# Patient Record
Sex: Female | Born: 1945 | Race: White | Hispanic: No | Marital: Married | Smoking: Never smoker
Health system: Southern US, Community
[De-identification: ages and names within clinical notes are randomized; demographics above are authoritative.]

## PROBLEM LIST (undated history)

## (undated) DIAGNOSIS — K219 Gastro-esophageal reflux disease without esophagitis: Secondary | ICD-10-CM

## (undated) DIAGNOSIS — M1990 Unspecified osteoarthritis, unspecified site: Secondary | ICD-10-CM

## (undated) DIAGNOSIS — I4891 Unspecified atrial fibrillation: Secondary | ICD-10-CM

## (undated) DIAGNOSIS — J45909 Unspecified asthma, uncomplicated: Secondary | ICD-10-CM

## (undated) DIAGNOSIS — K589 Irritable bowel syndrome without diarrhea: Secondary | ICD-10-CM

## (undated) DIAGNOSIS — I10 Essential (primary) hypertension: Secondary | ICD-10-CM

## (undated) HISTORY — PX: KNEE RECONSTRUCTION: SHX5883

## (undated) HISTORY — PX: BREAST BIOPSY: SHX20

## (undated) HISTORY — PX: CHOLECYSTECTOMY: SHX55

---

## 2004-02-23 ENCOUNTER — Ambulatory Visit: Payer: Self-pay | Admitting: Internal Medicine

## 2004-02-26 ENCOUNTER — Ambulatory Visit: Payer: Self-pay | Admitting: Internal Medicine

## 2005-01-10 ENCOUNTER — Ambulatory Visit: Payer: Self-pay | Admitting: Internal Medicine

## 2005-05-05 ENCOUNTER — Ambulatory Visit: Payer: Self-pay | Admitting: Internal Medicine

## 2006-05-02 HISTORY — PX: GASTRIC BYPASS: SHX52

## 2006-05-17 ENCOUNTER — Ambulatory Visit: Payer: Self-pay | Admitting: Internal Medicine

## 2006-05-22 ENCOUNTER — Ambulatory Visit: Payer: Self-pay | Admitting: Internal Medicine

## 2007-03-11 ENCOUNTER — Ambulatory Visit: Payer: Self-pay | Admitting: Internal Medicine

## 2007-07-05 ENCOUNTER — Ambulatory Visit: Payer: Self-pay | Admitting: Family Medicine

## 2009-07-23 ENCOUNTER — Ambulatory Visit: Payer: Self-pay | Admitting: Family Medicine

## 2009-09-28 ENCOUNTER — Ambulatory Visit: Payer: Self-pay | Admitting: Internal Medicine

## 2010-09-09 ENCOUNTER — Ambulatory Visit: Payer: Self-pay | Admitting: Family Medicine

## 2014-11-11 ENCOUNTER — Other Ambulatory Visit: Payer: Self-pay | Admitting: Internal Medicine

## 2014-11-11 DIAGNOSIS — Z1231 Encounter for screening mammogram for malignant neoplasm of breast: Secondary | ICD-10-CM

## 2014-11-20 ENCOUNTER — Ambulatory Visit
Admission: RE | Admit: 2014-11-20 | Discharge: 2014-11-20 | Disposition: A | Discharge status: Home / Self Care | Payer: Medicare PPO | Source: Ambulatory Visit | Attending: Internal Medicine | Admitting: Internal Medicine

## 2014-11-20 DIAGNOSIS — Z1231 Encounter for screening mammogram for malignant neoplasm of breast: Secondary | ICD-10-CM | POA: Insufficient documentation

## 2014-12-01 ENCOUNTER — Other Ambulatory Visit: Payer: Self-pay | Admitting: Gastroenterology

## 2014-12-01 DIAGNOSIS — K589 Irritable bowel syndrome without diarrhea: Secondary | ICD-10-CM

## 2014-12-08 ENCOUNTER — Ambulatory Visit
Admission: RE | Admit: 2014-12-08 | Discharge: 2014-12-08 | Disposition: A | Discharge status: Home / Self Care | Payer: Medicare PPO | Source: Ambulatory Visit | Attending: Gastroenterology | Admitting: Gastroenterology

## 2014-12-08 DIAGNOSIS — K589 Irritable bowel syndrome without diarrhea: Secondary | ICD-10-CM | POA: Insufficient documentation

## 2014-12-09 ENCOUNTER — Ambulatory Visit: Payer: Medicare PPO

## 2015-04-04 ENCOUNTER — Encounter: Payer: Self-pay | Admitting: Emergency Medicine

## 2015-04-04 ENCOUNTER — Emergency Department
Admission: EM | Admit: 2015-04-04 | Discharge: 2015-04-04 | Disposition: A | Discharge status: Home / Self Care | Payer: Medicare PPO | Attending: Emergency Medicine | Admitting: Emergency Medicine

## 2015-04-04 DIAGNOSIS — I480 Paroxysmal atrial fibrillation: Secondary | ICD-10-CM | POA: Insufficient documentation

## 2015-04-04 DIAGNOSIS — R531 Weakness: Secondary | ICD-10-CM | POA: Diagnosis present

## 2015-04-04 DIAGNOSIS — I10 Essential (primary) hypertension: Secondary | ICD-10-CM | POA: Diagnosis not present

## 2015-04-04 DIAGNOSIS — Z880 Allergy status to penicillin: Secondary | ICD-10-CM | POA: Diagnosis not present

## 2015-04-04 HISTORY — DX: Unspecified atrial fibrillation: I48.91

## 2015-04-04 HISTORY — DX: Irritable bowel syndrome without diarrhea: K58.9

## 2015-04-04 HISTORY — DX: Essential (primary) hypertension: I10

## 2015-04-04 HISTORY — DX: Unspecified osteoarthritis, unspecified site: M19.90

## 2015-04-04 HISTORY — DX: Unspecified asthma, uncomplicated: J45.909

## 2015-04-04 LAB — COMPREHENSIVE METABOLIC PANEL
ALBUMIN: 4.3 g/dL (ref 3.5–5.0)
ALT: 41 U/L (ref 14–54)
ANION GAP: 8 (ref 5–15)
AST: 36 U/L (ref 15–41)
Alkaline Phosphatase: 95 U/L (ref 38–126)
BUN: 13 mg/dL (ref 6–20)
CALCIUM: 9.4 mg/dL (ref 8.9–10.3)
CO2: 27 mmol/L (ref 22–32)
Chloride: 106 mmol/L (ref 101–111)
Creatinine, Ser: 0.53 mg/dL (ref 0.44–1.00)
GFR calc non Af Amer: >60 mL/min (ref >60)
GLUCOSE: 126 mg/dL — AB (ref 65–99)
POTASSIUM: 3.7 mmol/L (ref 3.5–5.1)
SODIUM: 141 mmol/L (ref 135–145)
TOTAL PROTEIN: 7.5 g/dL (ref 6.5–8.1)
Total Bilirubin: 0.4 mg/dL (ref 0.3–1.2)

## 2015-04-04 LAB — CBC
HCT: 42.1 % (ref 35.0–47.0)
Hemoglobin: 13.3 g/dL (ref 12.0–16.0)
MCH: 25.2 pg — AB (ref 26.0–34.0)
MCHC: 31.6 g/dL — AB (ref 32.0–36.0)
MCV: 79.6 fL — ABNORMAL LOW (ref 80.0–100.0)
Platelets: 454 10*3/uL — ABNORMAL HIGH (ref 150–440)
RBC: 5.29 MIL/uL — ABNORMAL HIGH (ref 3.80–5.20)
RDW: 16.4 % — AB (ref 11.5–14.5)
WBC: 9.3 10*3/uL (ref 3.6–11.0)

## 2015-04-04 LAB — TROPONIN I: Troponin I: <0.03 ng/mL (ref <0.031)

## 2015-04-04 MED ORDER — SODIUM CHLORIDE 0.9 % IV SOLN
1000.0000 mL | Freq: Once | INTRAVENOUS | Status: AC
  Administered 2015-04-04: 1000 mL via INTRAVENOUS

## 2015-04-04 MED ORDER — METOPROLOL TARTRATE 1 MG/ML IV SOLN
2.5000 mg | Freq: Once | INTRAVENOUS | Status: AC
  Administered 2015-04-04: 2.5 mg via INTRAVENOUS
  Filled 2015-04-04: qty 5

## 2015-04-04 MED ORDER — SODIUM CHLORIDE 0.9 % IV BOLUS (SEPSIS)
1000.0000 mL | Freq: Once | INTRAVENOUS | Status: AC
  Administered 2015-04-04: 1000 mL via INTRAVENOUS

## 2015-04-04 NOTE — Discharge Instructions (Signed)

## 2015-04-04 NOTE — ED Notes (Signed)
NAD noted at time of D/C. Pt denies comments/concerns at this time. Pt ambulatory to the lobby to wait for her husband at this time.

## 2015-04-04 NOTE — ED Provider Notes (Signed)
[facility] Emergency Department Provider Note  ____________________________________________  Time seen: On arrival  I have reviewed the triage vital signs and the nursing notes.   HISTORY  Chief Complaint Weakness    HPI Bethany Maxwell is a 69 y.o. female who presents with complaints of lightheadedness and dizziness and generalized weakness. Patient reports last night she was watching TV and felt her heart go into atrial fibrillation, this is happened before. Typically she is in a normal rhythm. She takes metoprolol 50 mg every morning. Because she felt her heart was racing and her blood pressure was high she took a full second dose of metoprolol at 11 PM last night. After a couple of hours it did not improve so she took another dose of 50 mg of metoprolol. She woke up in the middle the night feeling weak and dizzy but decided to see if he'll get better. This morning she continued to feel very weak and lightheaded so came to the emergency department. She denies chest pain at this time. No recent travel. No calf pain. No shortness of breath. No cough, no dysuria.     Past Medical History  Diagnosis Date  . Hypertension   . Atrial fibrillation (HCC)   . IBS (irritable bowel syndrome)   . Osteoarthritis   . Asthma     There are no active problems to display for this patient.   Past Surgical History  Procedure Laterality Date  . Breast biopsy Right     neg- needle bx-no scar  . Gastric bypass  2008  . Knee reconstruction      bilat  . Cholecystectomy      No current outpatient prescriptions on file.  Allergies Cyclobenzaprine and Penicillins  Family History  Problem Relation Age of Onset  . Breast cancer Maternal Aunt 75  . Breast cancer Cousin     Social History Social History  Substance Use Topics  . Smoking status: Never Smoker   . Smokeless tobacco: None  . Alcohol Use: No    Review of Systems  Constitutional: Negative for  fever. Eyes: Negative for visual changes. ENT: Negative for sore throat Cardiovascular: Positive for chest tightness last night, palpitations Respiratory: Negative for shortness of breath. Gastrointestinal: Negative for abdominal pain, vomiting and diarrhea. Genitourinary: Negative for dysuria. Musculoskeletal: Negative for back pain. Skin: Negative for rash. Neurological: Negative for headaches or focal weakness Psychiatric: No anxiety    ____________________________________________   PHYSICAL EXAM:  VITAL SIGNS: ED Triage Vitals  Enc Vitals Group     BP 04/04/15 0918 111/65 mmHg     Pulse Rate 04/04/15 0918 134     Resp 04/04/15 0918 18     Temp 04/04/15 0918 97.6 F (36.4 C)     Temp Source 04/04/15 0918 Oral     SpO2 04/04/15 0918 99 %     Weight --      Height --      Head Cir --      Peak Flow --      Pain Score 04/04/15 0900 0     Pain Loc --      Pain Edu? --      Excl. in GC? --      Constitutional: Alert and oriented. Well appearing and in no distress. Pleasant and interactive Eyes: Conjunctivae are normal.  ENT   Head: Normocephalic and atraumatic.   Mouth/Throat: Mucous membranes are moist. Cardiovascular: Tachycardia, irregularly irregular rhythm. Normal and symmetric distal pulses are present  in all extremities. No murmurs, rubs, or gallops. Respiratory: Normal respiratory effort without tachypnea nor retractions. Breath sounds are clear and equal bilaterally.  Gastrointestinal: Soft and non-tender in all quadrants. No distention. There is no CVA tenderness. Genitourinary: deferred Musculoskeletal: Nontender with normal range of motion in all extremities. No lower extremity tenderness nor edema. Neurologic:  Normal speech and language. No gross focal neurologic deficits are appreciated. Skin:  Skin is warm, dry and intact. No rash noted. Psychiatric: Mood and affect are normal. Patient exhibits appropriate insight and  judgment.  ____________________________________________    LABS (pertinent positives/negatives)  Labs Reviewed - No data to display  ____________________________________________   EKG  ED ECG REPORT I, Jene EveryKINNER, Kynnedy Carreno, the attending physician, personally viewed and interpreted this ECG.   Date: 04/04/2015  EKG Time: 9:13 AM  Rate: 134  Rhythm: Atrial fibrillation  Axis: Left axis deviation  Intervals:none  ST&T Change: Nonspecific   ____________________________________________    RADIOLOGY I have personally reviewed any xrays that were ordered on this patient: None  ____________________________________________   PROCEDURES  Procedure(s) performed: none  Critical Care performed: none  ____________________________________________   INITIAL IMPRESSION / ASSESSMENT AND PLAN / ED COURSE  Pertinent labs & imaging results that were available during my care of the patient were reviewed by me and considered in my medical decision making (see chart for details).  Patient presents with atrial fibrillation with apparent RVR. This may be the cause of her dizziness although we need to be careful because triage nurse reported a low heart rate on initial triage and given that patient has taken significant extra metoprolol and we will start with IV fluids and hydration and observe her heart rate. If she continues to be tachycardic we will then give Cardizem or metoprolol IV. In the meantime we'll check cardiac enzymes and monitor the patient  ----------------------------------------- 12:49 PM on 04/04/2015 -----------------------------------------  Discussed with cardiology Dr. Lady GaryFath, he agrees with 2.5 mg of IV Lopressor, and fluids and Dr. Darrold JunkerParaschos can see the patient in the office early this week.  Discussed this plan with patient and she agrees. She will take half of her usual metoprolol dose at home. She feels well and has no dizziness. Heart rate is fluctuating from the  low 100s to 110, blood pressure is normal . Labs are unremarkable. Return precautions discussed   ____________________________________________   FINAL CLINICAL IMPRESSION(S) / ED DIAGNOSES  Final diagnoses:  Paroxysmal atrial fibrillation (HCC)     Jene Everyobert Maudine Kluesner, MD 04/04/15 1414

## 2015-04-04 NOTE — ED Notes (Signed)
Reports feeling sob and weak last pm, bp was high so pt took extra metoprolol, now bp is low and pt feels more weak.  Grips equal, face symmetrical.

## 2015-11-11 ENCOUNTER — Other Ambulatory Visit: Payer: Self-pay | Admitting: Internal Medicine

## 2015-11-11 DIAGNOSIS — Z1231 Encounter for screening mammogram for malignant neoplasm of breast: Secondary | ICD-10-CM

## 2015-11-26 ENCOUNTER — Other Ambulatory Visit: Payer: Self-pay | Admitting: Internal Medicine

## 2015-11-26 ENCOUNTER — Ambulatory Visit
Admission: RE | Admit: 2015-11-26 | Discharge: 2015-11-26 | Disposition: A | Discharge status: Home / Self Care | Payer: Medicare Other | Source: Ambulatory Visit | Attending: Internal Medicine | Admitting: Internal Medicine

## 2015-11-26 ENCOUNTER — Ambulatory Visit (HOSPITAL_BASED_OUTPATIENT_CLINIC_OR_DEPARTMENT_OTHER): Payer: Medicare PPO

## 2015-11-26 DIAGNOSIS — Z1231 Encounter for screening mammogram for malignant neoplasm of breast: Secondary | ICD-10-CM

## 2016-05-01 ENCOUNTER — Encounter: Payer: Self-pay | Admitting: Emergency Medicine

## 2016-05-01 ENCOUNTER — Ambulatory Visit
Admission: EM | Admit: 2016-05-01 | Discharge: 2016-05-01 | Disposition: A | Discharge status: Home / Self Care | Payer: Medicare Other | Attending: Family Medicine | Admitting: Family Medicine

## 2016-05-01 DIAGNOSIS — J0100 Acute maxillary sinusitis, unspecified: Secondary | ICD-10-CM

## 2016-05-01 MED ORDER — AZITHROMYCIN 250 MG PO TABS: 250.0000 mg | ORAL_TABLET | Freq: Every day | ORAL | 0 refills | Status: DC

## 2016-05-01 NOTE — ED Provider Notes (Signed)
CSN: [id number]     Arrival date & time 05/01/16  [id number] History   First MD Initiated Contact with Patient 05/01/16 1003     Chief Complaint  Patient presents with  . Facial Pain  . Nasal Congestion   (Consider location/radiation/quality/duration/timing/severity/associated sxs/prior Treatment) Patient is a 70 year old female, presents today for 3 day history of severe nasal congestion, sinus pressure and pain, sneezing and coughing. Patient denies fever or chills, she does endorses extreme fatigue. Denies any alleviating or aggravating factor. Modifying factors: none.   Blood pressure noted to be very elevated today. Patient does have history of hypertension. Patient reports that her blood pressure is normally 110/70. She reports being very stressful recently.      Past Medical History:  Diagnosis Date  . Asthma   . Atrial fibrillation (HCC)   . Hypertension   . IBS (irritable bowel syndrome)   . Osteoarthritis    Past Surgical History:  Procedure Laterality Date  . BREAST BIOPSY Right    neg- needle bx-no scar  . CHOLECYSTECTOMY    . GASTRIC BYPASS  2008  . KNEE RECONSTRUCTION     bilat   Family History  Problem Relation Age of Onset  . Breast cancer Maternal Aunt 75  . Breast cancer Cousin     3 cousins both sides  . Breast cancer Paternal Aunt    Social History  Substance Use Topics  . Smoking status: Never Smoker  . Smokeless tobacco: Never Used  . Alcohol use No   OB History    No data available     Review of Systems  Constitutional: Positive for fatigue. Negative for chills and fever.  HENT: Positive for congestion, sinus pain, sinus pressure and sneezing. Negative for rhinorrhea and sore throat.   Respiratory: Positive for cough. Negative for shortness of breath and wheezing.   Cardiovascular: Negative for chest pain and palpitations.  Gastrointestinal: Negative for abdominal pain, nausea and vomiting.  Musculoskeletal: Negative for myalgias.   Neurological: Negative for dizziness and headaches.    Allergies  Cyclobenzaprine and Penicillins  Home Medications   Prior to Admission medications   Medication Sig Start Date End Date Taking? Authorizing Provider  fluticasone (FLONASE) 50 MCG/ACT nasal spray Place 1 spray into both nostrils daily.   Yes Historical Provider, MD  magnesium oxide (MAG-OX) 400 MG tablet Take 400 mg by mouth daily.   Yes Historical Provider, MD  metoprolol (LOPRESSOR) 50 MG tablet Take 50 mg by mouth 2 (two) times daily.   Yes Historical Provider, MD  Multiple Vitamin (MULTIVITAMIN) tablet Take 1 tablet by mouth daily.   Yes Historical Provider, MD  omeprazole (PRILOSEC) 20 MG capsule Take 20 mg by mouth daily.   Yes Historical Provider, MD  warfarin (COUMADIN) 3 MG tablet Take 3 mg by mouth daily.   Yes Historical Provider, MD  azithromycin (ZITHROMAX) 250 MG tablet Take 1 tablet (250 mg total) by mouth daily. Take first 2 tablets together, then 1 every day until finished. 05/01/16   Lucia EstelleFeng Markcus Lazenby, NP   Meds Ordered and Administered this Visit  Medications - No data to display  BP (!) 166/86   Pulse 60   Temp 97.8 F (36.6 C) (Oral)   Resp 16   Ht 5\' 3"  (1.6 m)   Wt 185 lb (83.9 kg)   SpO2 100%   BMI 32.77 kg/m  No data found.   Physical Exam  Constitutional: She is oriented to person, place, and time. She appears well-developed and  well-nourished.  HENT:  Head: Normocephalic and atraumatic.  Right Ear: External ear normal.  Left Ear: External ear normal.  Nose: Nose normal.  Mouth/Throat: Oropharynx is clear and moist. No oropharyngeal exudate.  TMs pearly gray bilaterally. Maxillary sinuses are very tender to palpate and percuss.   Eyes: Conjunctivae are normal. Pupils are equal, round, and reactive to light.  Neck: Normal range of motion.  Cardiovascular: Normal rate, regular rhythm and normal heart sounds.   No murmur heard. Heart rhythm is regular rate now. Does have a history of  paroxysmal A. fib  Pulmonary/Chest: Effort normal and breath sounds normal.  Abdominal: Soft. Bowel sounds are normal.  Lymphadenopathy:    She has no cervical adenopathy.  Neurological: She is alert and oriented to person, place, and time.  Skin: Skin is warm and dry.  Nursing note and vitals reviewed.   Urgent Care Course   Clinical Course     Procedures (including critical care time)  Labs Review Labs Reviewed - No data to display  Imaging Review No results found.  MDM   1. Acute maxillary sinusitis, recurrence not specified    Will treat empirically for sinusitis. Patient prefers z-pack. Prescription given.  Reviewed directions for usage and side effects. Patient states understanding and will call with questions or problems.   Continue to monitor blood pressure at home. F/u with  PCP as scheduled or sooner if BP remains elevated at home.    Lucia EstelleFeng Cailah Reach, NP 05/01/16 1016

## 2016-05-01 NOTE — ED Triage Notes (Signed)
Patient c/o sinus congestion and pressure that started yesterday. Patient reports cough that started this morning. Patient denies fevers.

## 2016-10-13 ENCOUNTER — Other Ambulatory Visit: Payer: Self-pay | Admitting: Internal Medicine

## 2016-10-13 DIAGNOSIS — Z1231 Encounter for screening mammogram for malignant neoplasm of breast: Secondary | ICD-10-CM

## 2016-10-28 IMAGING — MG MM DIGITAL SCREENING BILAT W/ CAD
1 series · 4 of 4 positions shown · non-contrast
Comparison: Previous exam(s).

CLINICAL DATA: Screening.

EXAM:
DIGITAL SCREENING BILATERAL MAMMOGRAM WITH CAD

[R CC · right · 4 of 4 slices shown]
[im 1/4]
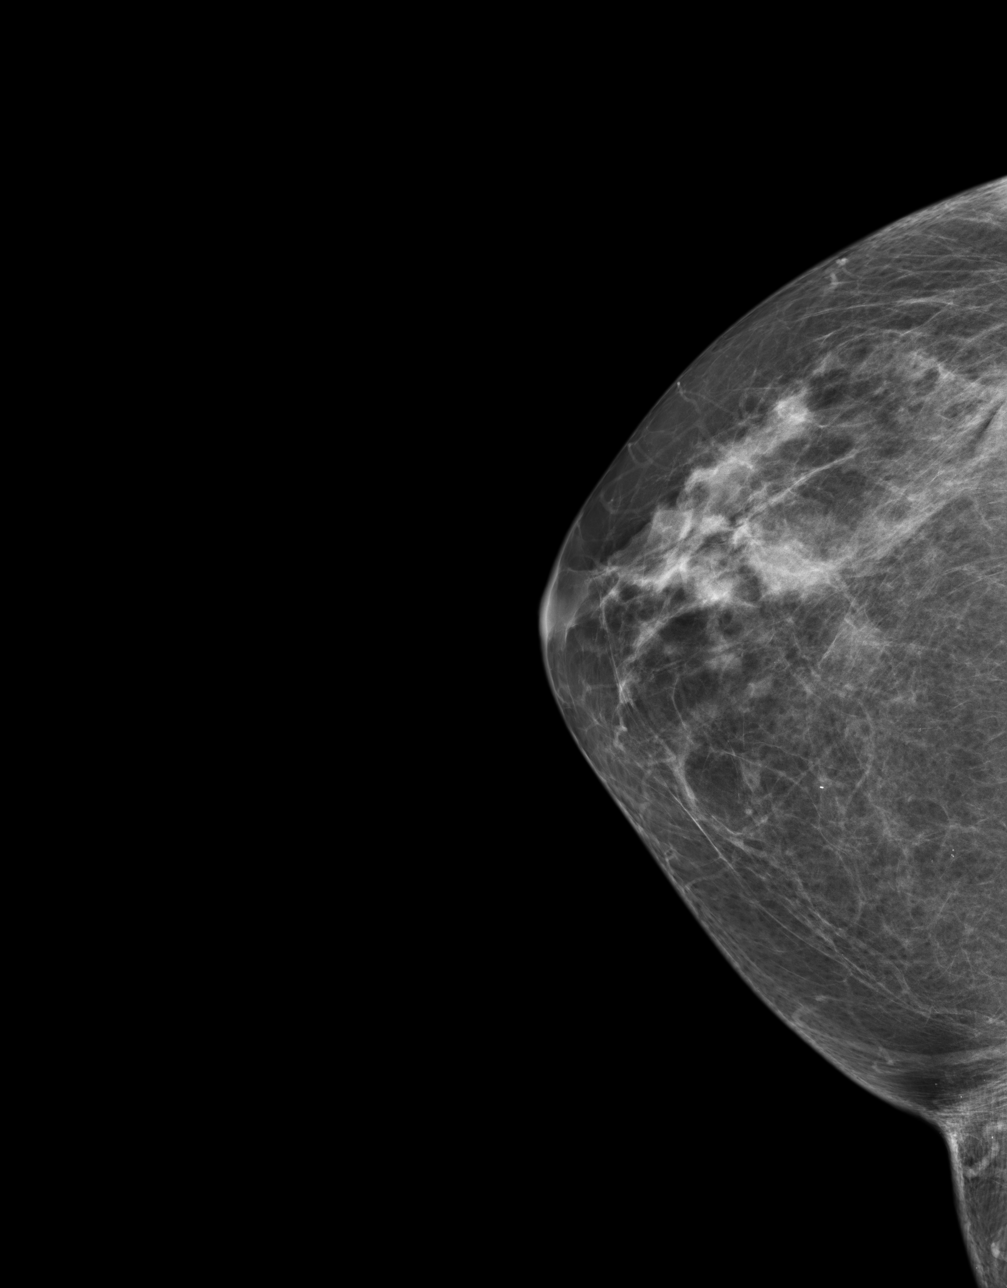
[im 2/4]
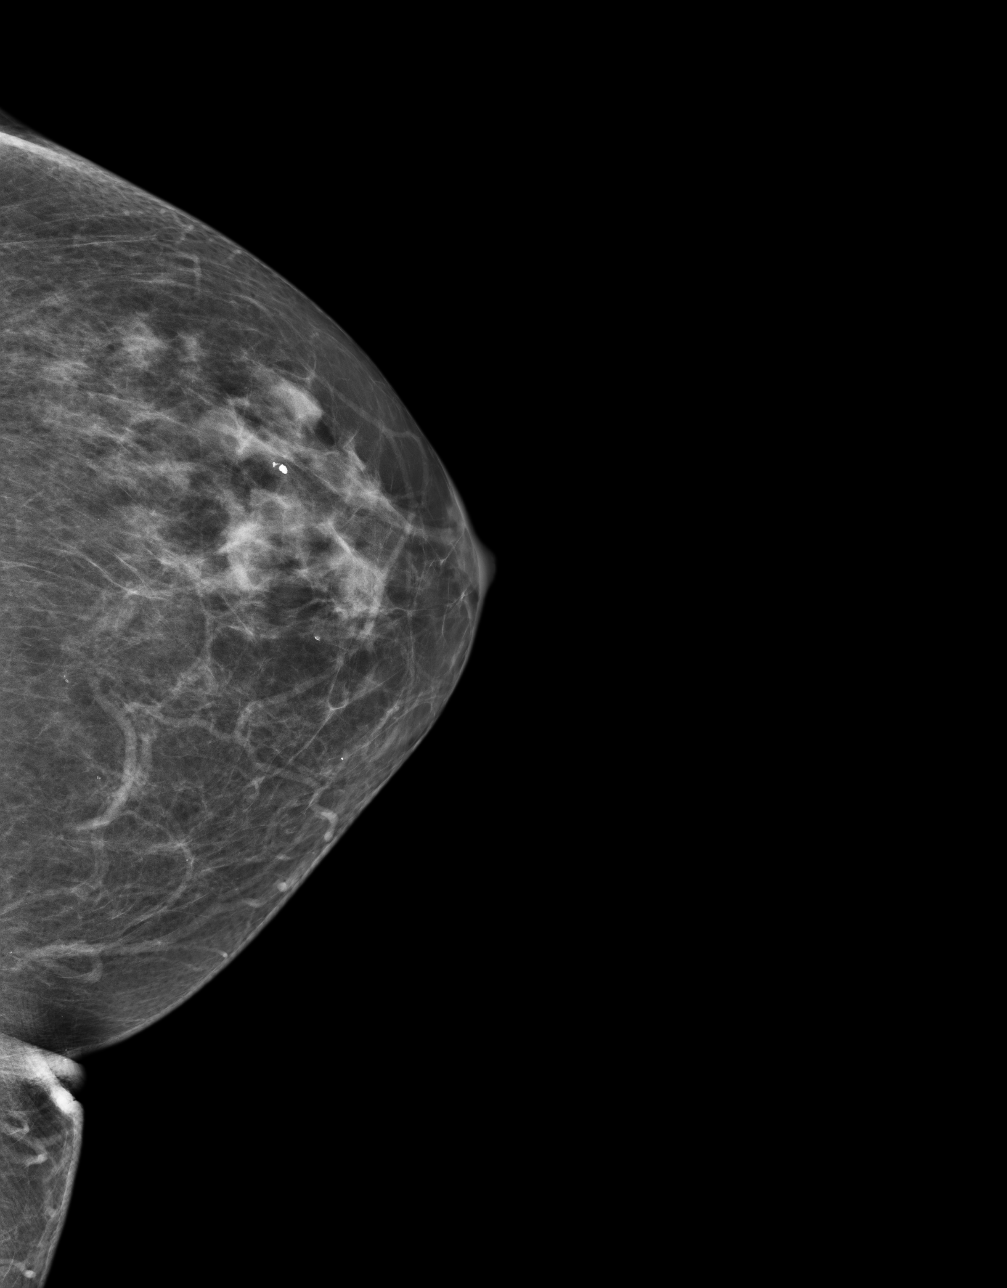
[im 3/4]
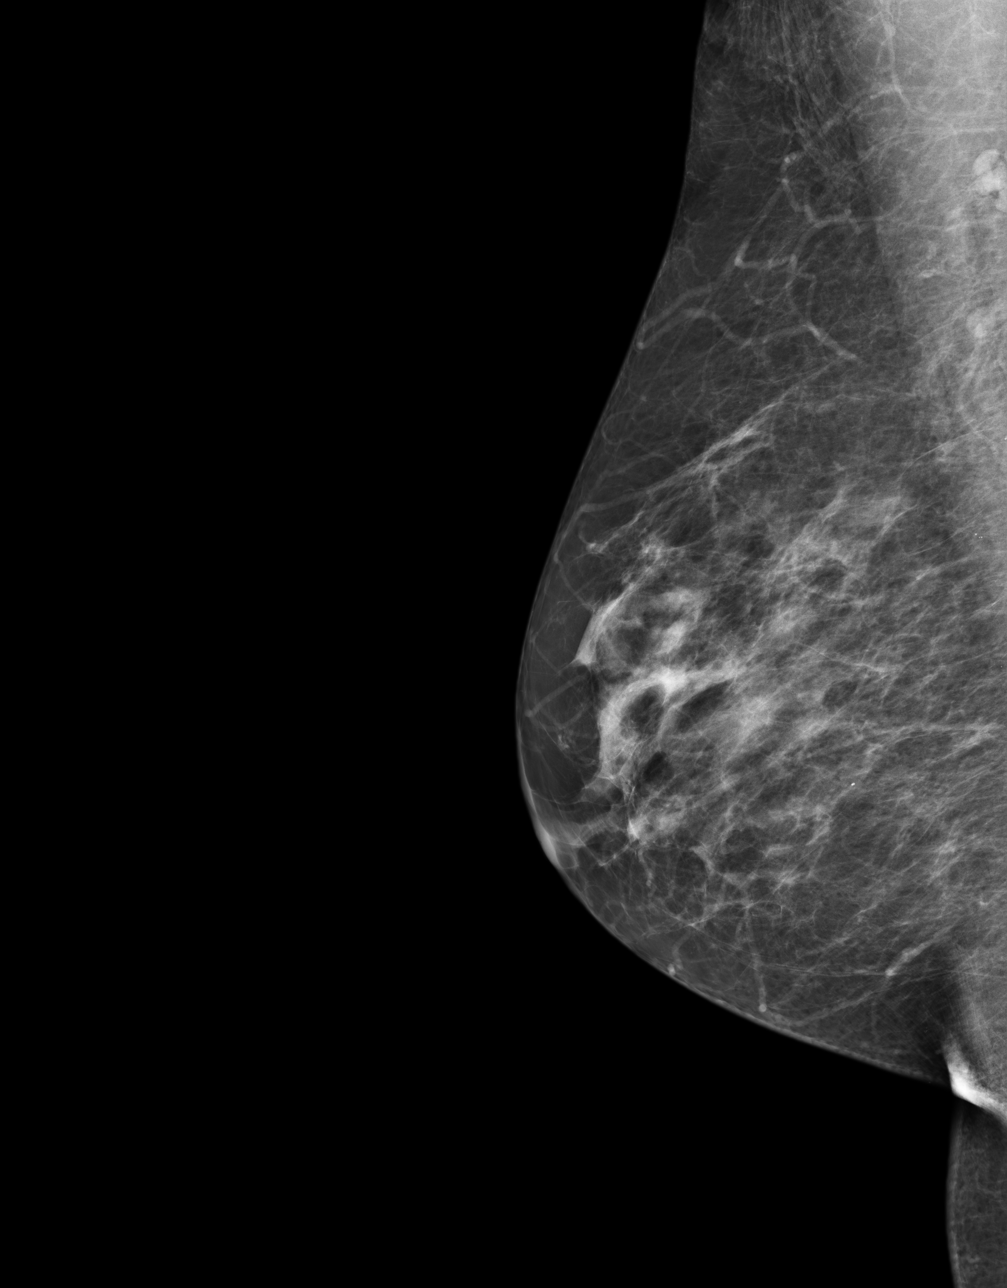
[im 4/4]
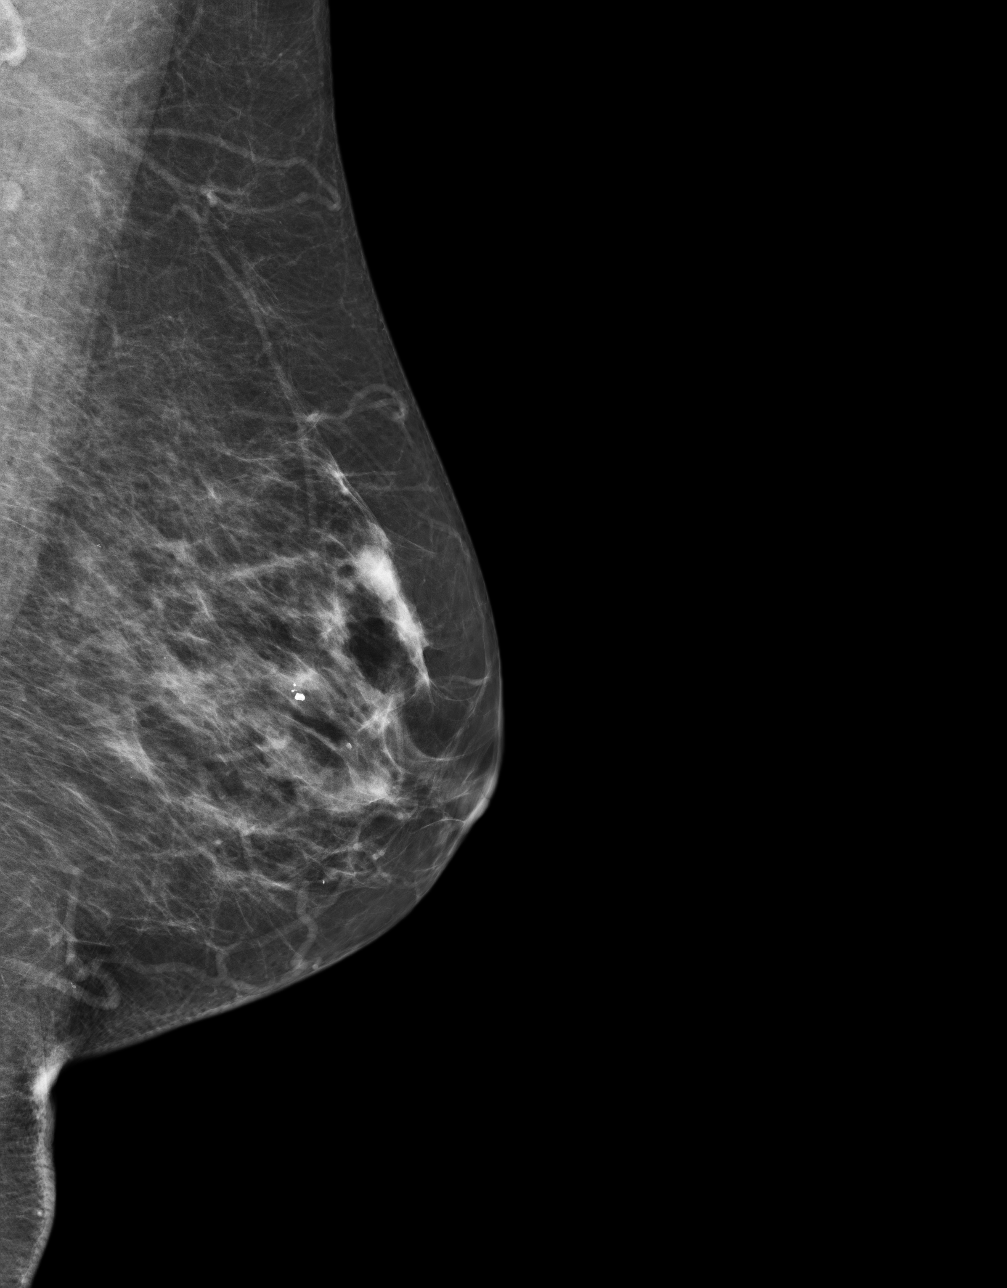

[4 of 4 positions shown; findings below may reference images not displayed]

ACR Breast Density Category b: There are scattered areas of
fibroglandular density.
FINDINGS: There are no findings suspicious for malignancy. Images were
processed with CAD.
IMPRESSION: No mammographic evidence of malignancy. A result letter of this
screening mammogram will be mailed directly to the patient.

RECOMMENDATION:
Screening mammogram in one year. (Code:AS-G-LCT)

BI-RADS CATEGORY  1: Negative.

## 2016-11-28 ENCOUNTER — Ambulatory Visit
Admission: RE | Admit: 2016-11-28 | Discharge: 2016-11-28 | Disposition: A | Discharge status: Home / Self Care | Payer: Medicare Other | Source: Ambulatory Visit | Attending: Internal Medicine | Admitting: Internal Medicine

## 2016-11-28 DIAGNOSIS — Z1231 Encounter for screening mammogram for malignant neoplasm of breast: Secondary | ICD-10-CM | POA: Insufficient documentation

## 2016-12-11 DIAGNOSIS — R0789 Other chest pain: Principal | ICD-10-CM | POA: Insufficient documentation

## 2016-12-11 DIAGNOSIS — Z803 Family history of malignant neoplasm of breast: Secondary | ICD-10-CM | POA: Diagnosis not present

## 2016-12-11 DIAGNOSIS — R079 Chest pain, unspecified: Secondary | ICD-10-CM | POA: Diagnosis present

## 2016-12-11 DIAGNOSIS — Z888 Allergy status to other drugs, medicaments and biological substances status: Secondary | ICD-10-CM | POA: Diagnosis not present

## 2016-12-11 DIAGNOSIS — K219 Gastro-esophageal reflux disease without esophagitis: Secondary | ICD-10-CM | POA: Insufficient documentation

## 2016-12-11 DIAGNOSIS — J45909 Unspecified asthma, uncomplicated: Secondary | ICD-10-CM | POA: Diagnosis not present

## 2016-12-11 DIAGNOSIS — I200 Unstable angina: Secondary | ICD-10-CM | POA: Diagnosis not present

## 2016-12-11 DIAGNOSIS — Z9884 Bariatric surgery status: Secondary | ICD-10-CM | POA: Insufficient documentation

## 2016-12-11 DIAGNOSIS — Z7951 Long term (current) use of inhaled steroids: Secondary | ICD-10-CM | POA: Diagnosis not present

## 2016-12-11 DIAGNOSIS — I10 Essential (primary) hypertension: Secondary | ICD-10-CM | POA: Diagnosis not present

## 2016-12-11 DIAGNOSIS — Z881 Allergy status to other antibiotic agents status: Secondary | ICD-10-CM | POA: Diagnosis not present

## 2016-12-11 DIAGNOSIS — I480 Paroxysmal atrial fibrillation: Secondary | ICD-10-CM | POA: Insufficient documentation

## 2016-12-11 DIAGNOSIS — Z7901 Long term (current) use of anticoagulants: Secondary | ICD-10-CM | POA: Insufficient documentation

## 2016-12-11 DIAGNOSIS — Z9049 Acquired absence of other specified parts of digestive tract: Secondary | ICD-10-CM | POA: Insufficient documentation

## 2016-12-11 DIAGNOSIS — Z880 Allergy status to penicillin: Secondary | ICD-10-CM | POA: Insufficient documentation

## 2016-12-11 DIAGNOSIS — M1990 Unspecified osteoarthritis, unspecified site: Secondary | ICD-10-CM | POA: Insufficient documentation

## 2016-12-11 NOTE — ED Triage Notes (Signed)
Patient to ED for chest pressure. States she had an abnormal EKG at Dr. Cassie FreerParachos' office. Called her the next day and scheduled a stress test but has not had that appt yet.  Patient states she a history of Afib and wore a holter monitor in July but it only captured Afib. Patient currently able to speak in complete sentences without noted distress.

## 2016-12-12 ENCOUNTER — Encounter: Payer: Self-pay | Admitting: Internal Medicine

## 2016-12-12 ENCOUNTER — Emergency Department: Payer: Medicare Other

## 2016-12-12 ENCOUNTER — Observation Stay: Payer: Medicare Other

## 2016-12-12 ENCOUNTER — Observation Stay
Admission: EM | Admit: 2016-12-12 | Discharge: 2016-12-12 | Disposition: A | Discharge status: Home / Self Care | Payer: Medicare Other | Attending: Internal Medicine | Admitting: Internal Medicine

## 2016-12-12 ENCOUNTER — Observation Stay: Admit: 2016-12-12 | Payer: Medicare Other

## 2016-12-12 DIAGNOSIS — R079 Chest pain, unspecified: Secondary | ICD-10-CM | POA: Diagnosis present

## 2016-12-12 DIAGNOSIS — I200 Unstable angina: Secondary | ICD-10-CM

## 2016-12-12 DIAGNOSIS — I10 Essential (primary) hypertension: Secondary | ICD-10-CM | POA: Diagnosis present

## 2016-12-12 DIAGNOSIS — K219 Gastro-esophageal reflux disease without esophagitis: Secondary | ICD-10-CM | POA: Diagnosis present

## 2016-12-12 DIAGNOSIS — I480 Paroxysmal atrial fibrillation: Secondary | ICD-10-CM | POA: Diagnosis present

## 2016-12-12 HISTORY — DX: Gastro-esophageal reflux disease without esophagitis: K21.9

## 2016-12-12 LAB — BASIC METABOLIC PANEL
ANION GAP: 5 (ref 5–15)
Anion gap: 7 (ref 5–15)
BUN: 9 mg/dL (ref 6–20)
BUN: 9 mg/dL (ref 6–20)
CHLORIDE: 108 mmol/L (ref 101–111)
CO2: 27 mmol/L (ref 22–32)
CO2: 27 mmol/L (ref 22–32)
Calcium: 8.7 mg/dL — ABNORMAL LOW (ref 8.9–10.3)
Calcium: 9.2 mg/dL (ref 8.9–10.3)
Chloride: 105 mmol/L (ref 101–111)
Creatinine, Ser: 0.57 mg/dL (ref 0.44–1.00)
Creatinine, Ser: 0.63 mg/dL (ref 0.44–1.00)
GFR calc Af Amer: >60 mL/min (ref >60)
GLUCOSE: 116 mg/dL — AB (ref 65–99)
Glucose, Bld: 97 mg/dL (ref 65–99)
POTASSIUM: 4.2 mmol/L (ref 3.5–5.1)
POTASSIUM: 4.3 mmol/L (ref 3.5–5.1)
SODIUM: 140 mmol/L (ref 135–145)
Sodium: 139 mmol/L (ref 135–145)

## 2016-12-12 LAB — CBC
HEMATOCRIT: 34.7 % — AB (ref 35.0–47.0)
HEMATOCRIT: 37.9 % (ref 35.0–47.0)
HEMOGLOBIN: 11.4 g/dL — AB (ref 12.0–16.0)
HEMOGLOBIN: 12.7 g/dL (ref 12.0–16.0)
MCH: 27.2 pg (ref 26.0–34.0)
MCH: 27.7 pg (ref 26.0–34.0)
MCHC: 32.8 g/dL (ref 32.0–36.0)
MCHC: 33.4 g/dL (ref 32.0–36.0)
MCV: 83 fL (ref 80.0–100.0)
MCV: 83 fL (ref 80.0–100.0)
Platelets: 321 10*3/uL (ref 150–440)
Platelets: 359 10*3/uL (ref 150–440)
RBC: 4.18 MIL/uL (ref 3.80–5.20)
RBC: 4.56 MIL/uL (ref 3.80–5.20)
RDW: 15.9 % — ABNORMAL HIGH (ref 11.5–14.5)
RDW: 16.2 % — ABNORMAL HIGH (ref 11.5–14.5)
WBC: 6.6 10*3/uL (ref 3.6–11.0)
WBC: 7.4 10*3/uL (ref 3.6–11.0)

## 2016-12-12 LAB — PROTIME-INR
INR: 3.17
Prothrombin Time: 33.2 seconds — ABNORMAL HIGH (ref 11.4–15.2)

## 2016-12-12 LAB — TROPONIN I
Troponin I: <0.03 ng/mL (ref <0.03)
Troponin I: <0.03 ng/mL (ref <0.03)

## 2016-12-12 MED ORDER — ACETAMINOPHEN 325 MG PO TABS
650.0000 mg | ORAL_TABLET | Freq: Four times a day (QID) | ORAL | Status: DC | PRN
  Administered 2016-12-12 (×2): 650 mg via ORAL
  Filled 2016-12-12 (×2): qty 2

## 2016-12-12 MED ORDER — HYDRALAZINE HCL 20 MG/ML IJ SOLN: 10.0000 mg | INTRAMUSCULAR | Status: DC | PRN

## 2016-12-12 MED ORDER — LISINOPRIL 5 MG PO TABS: 5.0000 mg | ORAL_TABLET | Freq: Every day | ORAL | 11 refills | Status: AC

## 2016-12-12 MED ORDER — ASPIRIN 81 MG PO CHEW
324.0000 mg | CHEWABLE_TABLET | Freq: Once | ORAL | Status: AC
  Administered 2016-12-12: 324 mg via ORAL
  Filled 2016-12-12: qty 4

## 2016-12-12 MED ORDER — OXYCODONE HCL 5 MG PO TABS: 5.0000 mg | ORAL_TABLET | ORAL | Status: DC | PRN

## 2016-12-12 MED ORDER — NITROGLYCERIN 2 % TD OINT
1.0000 [in_us] | TOPICAL_OINTMENT | Freq: Once | TRANSDERMAL | Status: AC
  Administered 2016-12-12: 1 [in_us] via TOPICAL
  Filled 2016-12-12: qty 1

## 2016-12-12 MED ORDER — ONDANSETRON HCL 4 MG PO TABS: 4.0000 mg | ORAL_TABLET | Freq: Four times a day (QID) | ORAL | Status: DC | PRN

## 2016-12-12 MED ORDER — REGADENOSON 0.4 MG/5ML IV SOLN
0.4000 mg | Freq: Once | INTRAVENOUS | Status: AC
  Administered 2016-12-12: 0.4 mg via INTRAVENOUS

## 2016-12-12 MED ORDER — METOPROLOL SUCCINATE ER 50 MG PO TB24: 50.0000 mg | ORAL_TABLET | Freq: Two times a day (BID) | ORAL | Status: DC

## 2016-12-12 MED ORDER — ACETAMINOPHEN 650 MG RE SUPP: 650.0000 mg | Freq: Four times a day (QID) | RECTAL | Status: DC | PRN

## 2016-12-12 MED ORDER — TECHNETIUM TC 99M TETROFOSMIN IV KIT
13.3400 | PACK | Freq: Once | INTRAVENOUS | Status: AC | PRN
  Administered 2016-12-12: 13.34 via INTRAVENOUS

## 2016-12-12 MED ORDER — PANTOPRAZOLE SODIUM 40 MG PO TBEC: 40.0000 mg | DELAYED_RELEASE_TABLET | Freq: Every day | ORAL | Status: DC

## 2016-12-12 MED ORDER — TECHNETIUM TC 99M TETROFOSMIN IV KIT
30.8500 | PACK | Freq: Once | INTRAVENOUS | Status: AC | PRN
  Administered 2016-12-12: 30.85 via INTRAVENOUS

## 2016-12-12 MED ORDER — ONDANSETRON HCL 4 MG/2ML IJ SOLN: 4.0000 mg | Freq: Four times a day (QID) | INTRAMUSCULAR | Status: DC | PRN

## 2016-12-12 NOTE — Progress Notes (Signed)
Patient admitted without any complications. Blood pressure is elevated but has improved. No complaints of pain. Home medication is being stored in pharmacy.

## 2016-12-12 NOTE — Progress Notes (Signed)
Patient alert and oriented, vss, no complaints of pain.  D/c telemetry and d/c piv.  No questions.  Escorted out of hospital via wheelchair by volunteers.    Medications returned to patient.

## 2016-12-12 NOTE — Progress Notes (Signed)
Chaplain responded to OR requisition for Advanced Directives. CH met with pt. Pt was delightful and talked about her children and grandchildren, and she also mentioned about where she worked before she retirement. Trosky educated pt on how to complete AD. Pt requested for time to review material and asked chaplain to come back later this afternoon to assist Pt complete AD.   12/12/16 1000  Clinical Encounter Type  Visited With Patient  Visit Type Initial  Referral From Nurse  Consult/Referral To Chaplain;Other (Comment)  Spiritual Encounters  Spiritual Needs Other (Comment)

## 2016-12-12 NOTE — Discharge Summary (Signed)
[facility] [place] [facility]   PATIENT NAME: Bethany Maxwell    MR#:  [id number]  DATE OF BIRTH:  February 18, 1946  DATE OF ADMISSION:  12/12/2016 ADMITTING PHYSICIAN: Bethany Manis, MD  DATE OF DISCHARGE: 12/12/2016  PRIMARY CARE PHYSICIAN: Bethany Farber, MD    ADMISSION DIAGNOSIS:  Unstable angina (HCC) [I20.0] Essential hypertension [I10] Chest pain, unspecified type [R07.9]  DISCHARGE DIAGNOSIS:  Principal Problem:   Chest pain Active Problems:   AF (paroxysmal atrial fibrillation) (HCC)   HTN (hypertension)   GERD (gastroesophageal reflux disease)   SECONDARY DIAGNOSIS:   Past Medical History:  Diagnosis Date  . Asthma   . Atrial fibrillation (HCC)   . GERD (gastroesophageal reflux disease)   . Hypertension   . IBS (irritable bowel syndrome)   . Osteoarthritis     HOSPITAL COURSE:   * Chest pain - cycle cardiac enzymes- stable., get an echocardiogram and cardiology consult    Nuclear stress test done and it was negative as per nurse report.  *  AF (paroxysmal atrial fibrillation) (HCC) - continue home meds including anticoagulation *  HTN (hypertension) - continue home medications, slightly high,added lisinopril 5 mg daily. *  GERD (gastroesophageal reflux disease) - home dose PPI  DISCHARGE CONDITIONS:   Stable.  CONSULTS OBTAINED:  Treatment Team:  Bethany Pea, MD  DRUG ALLERGIES:   Allergies  Allergen Reactions  . Clarithromycin Hives  . Cyclobenzaprine Other (See Comments)    "makes me loopy"  . Penicillins Swelling    Has patient had a PCN reaction causing immediate rash, facial/tongue/throat swelling, SOB or lightheadedness with hypotension: Yes Has patient had a PCN reaction causing severe rash involving mucus membranes or skin necrosis: No Has patient had a PCN reaction that required hospitalization: No Has patient had a PCN reaction occurring within the last 10 years: No If all of the above answers are "NO",  then may proceed with Cephalosporin use.    DISCHARGE MEDICATIONS:   Current Discharge Medication List    START taking these medications   Details  lisinopril (PRINIVIL,ZESTRIL) 5 MG tablet Take 1 tablet (5 mg total) by mouth daily. Qty: 30 tablet, Refills: 11      CONTINUE these medications which have NOT CHANGED   Details  acetaminophen (TYLENOL) 650 MG CR tablet Take 650 mg by mouth 2 (two) times daily.    fluticasone (FLONASE) 50 MCG/ACT nasal spray Place 1 spray into both nostrils daily.    magnesium oxide (MAG-OX) 400 MG tablet Take 400 mg by mouth daily.    metoprolol succinate (TOPROL-XL) 50 MG 24 hr tablet Take 50 mg by mouth 2 (two) times daily. Take with or immediately following a meal.    Multiple Vitamin (MULTIVITAMIN) tablet Take 1 tablet by mouth daily.    multivitamin-lutein (OCUVITE-LUTEIN) CAPS capsule Take 1 capsule by mouth daily.    omeprazole (PRILOSEC) 20 MG capsule Take 20 mg by mouth daily.    Turmeric 500 MG CAPS Take 1 capsule by mouth daily.    warfarin (COUMADIN) 3 MG tablet Take 3 mg by mouth daily.         DISCHARGE INSTRUCTIONS:    Follow with cardiology clinic in 1-2 weeks.  If you experience worsening of your admission symptoms, develop shortness of breath, life threatening emergency, suicidal or homicidal thoughts you must seek medical attention immediately by calling 911 or calling your MD immediately  if symptoms less severe.  You Must read complete instructions/literature along with all  the possible adverse reactions/side effects for all the Medicines you take and that have been prescribed to you. Take any new Medicines after you have completely understood and accept all the possible adverse reactions/side effects.   Please note  You were cared for by a hospitalist during your hospital stay. If you have any questions about your discharge medications or the care you received while you were in the hospital after you are discharged,  you can call the unit and asked to speak with the hospitalist on call if the hospitalist that took care of you is not available. Once you are discharged, your primary care physician will handle any further medical issues. Please note that NO REFILLS for any discharge medications will be authorized once you are discharged, as it is imperative that you return to your primary care physician (or establish a relationship with a primary care physician if you do not have one) for your aftercare needs so that they can reassess your need for medications and monitor your lab values.    Today   CHIEF COMPLAINT:   Chief Complaint  Patient presents with  . Chest Pain    HISTORY OF PRESENT ILLNESS:  Bethany Maxwell  is a 71 y.o. female presents with Left shoulder and left-sided chest pressure. Patient starts this has been going on since yesterday afternoon. Initially was intermittent, but by the evening he became persistent and constant. She came to the ED when she was getting ready to go to bed, the pain was not letting up. Here in the ED she was found to have a negative initial troponin. She states that she had been worked up recently and cardiology clinic and had an abnormal EKG. She was scheduled for outpatient stress test, but this has not occurred yet. Hospitalists were called for admission and further evaluation.    VITAL SIGNS:  Blood pressure 134/67, pulse (!) 47, temperature 97.7 F (36.5 C), temperature source Oral, resp. rate 18, height 5\' 2"  (1.575 m), weight 84.5 kg (186 lb 4.8 oz), SpO2 100 %.  I/O:   Intake/Output Summary (Last 24 hours) at 12/12/16 1601 Last data filed at 12/12/16 1100  Gross per 24 hour  Intake                0 ml  Output              800 ml  Net             -800 ml    PHYSICAL EXAMINATION:  GENERAL:  71 y.o.-year-old patient lying in the bed with no acute distress.  EYES: Pupils equal, round, reactive to light and accommodation. No scleral icterus. Extraocular  muscles intact.  HEENT: Head atraumatic, normocephalic. Oropharynx and nasopharynx clear.  NECK:  Supple, no jugular venous distention. No thyroid enlargement, no tenderness.  LUNGS: Normal breath sounds bilaterally, no wheezing, rales,rhonchi or crepitation. No use of accessory muscles of respiration.  CARDIOVASCULAR: S1, S2 normal. No murmurs, rubs, or gallops.  ABDOMEN: Soft, non-tender, non-distended. Bowel sounds present. No organomegaly or mass.  EXTREMITIES: No pedal edema, cyanosis, or clubbing.  NEUROLOGIC: Cranial nerves II through XII are intact. Muscle strength 5/5 in all extremities. Sensation intact. Gait not checked.  PSYCHIATRIC: The patient is alert and oriented x 3.  SKIN: No obvious rash, lesion, or ulcer.   DATA REVIEW:   CBC  Recent Labs Lab 12/12/16 0558  WBC 6.6  HGB 11.4*  HCT 34.7*  PLT 321    Chemistries  Recent Labs Lab 12/12/16 0558  NA 140  K 4.2  CL 108  CO2 27  GLUCOSE 97  BUN 9  CREATININE 0.57  CALCIUM 8.7*    Cardiac Enzymes  Recent Labs Lab 12/12/16 1331  TROPONINI <0.03    Microbiology Results  No results found for this or any previous visit.  RADIOLOGY:  Dg Chest 2 View  Result Date: 12/12/2016 CLINICAL DATA:  Acute onset of left-sided chest pressure. Initial encounter. EXAM: CHEST  2 VIEW COMPARISON:  None. FINDINGS: There is elevation of the right hemidiaphragm. Mild bibasilar atelectasis or scarring is noted. Mild vascular congestion is noted. There is no evidence of pleural effusion or pneumothorax. The heart is normal in size; the mediastinal contour is within normal limits. No acute osseous abnormalities are seen. IMPRESSION: Elevation of the right hemidiaphragm. Mild bibasilar atelectasis or scarring noted. Mild vascular congestion. Electronically Signed   By: Roanna Raider M.D.   On: 12/12/2016 00:50    EKG:   Orders placed or performed during the hospital encounter of 12/12/16  . ED EKG  . ED EKG within 10  minutes  . ED EKG  . ED EKG within 10 minutes      Management plans discussed with the patient, family and they are in agreement.  CODE STATUS:     Code Status Orders        Start     Ordered   12/12/16 0232  Full code  Continuous     12/12/16 0231    Code Status History    Date Active Date Inactive Code Status Order ID Comments User Context   This patient has a current code status but no historical code status.    Advance Directive Documentation     Most Recent Value  Type of Advance Directive  Living will  Pre-existing out of facility DNR order (yellow form or pink MOST form)  -  "MOST" Form in Place?  -      TOTAL TIME TAKING CARE OF THIS PATIENT: 35 minutes.    Altamese Dilling M.D on 12/12/2016 at 4:01 PM  Between 7am to 6pm - Pager - [phone]  After 6pm go to [email or web address] - Social research officer, government  [facility]  Office  [phone]  CC: Primary care physician; Bethany Farber, MD   Note: This dictation was prepared with Dragon dictation along with smaller phrase technology. Any transcriptional errors that result from this process are unintentional.

## 2016-12-12 NOTE — Progress Notes (Signed)
Chaplain made a follow up visit with pt to help pt to complete AD. Pt was ready to complete AD. CH contacted a Dance movement psychotherapistotary Public and 2 witness and assisted pt complete AD. CH gave original and 2 copies to pt and placed 1 copy in pt's medical chart.    12/12/16 1400  Clinical Encounter Type  Visited With Patient;Health care provider  Visit Type Follow-up;Other (Comment)  Referral From Chaplain  Spiritual Encounters  Spiritual Needs Other (Comment)

## 2016-12-12 NOTE — H&P (Signed)
[facility] - [place] at [facility]   PATIENT NAME: Bethany Maxwell    MR#:  [id number]  DATE OF BIRTH:  [birth year]  DATE OF ADMISSION:  12/12/2016  PRIMARY CARE PHYSICIAN: Mickey Farberhies, Deboraha Goar, MD   REQUESTING/REFERRING PHYSICIAN: Dolores FrameSung, MD  CHIEF COMPLAINT:   Chief Complaint  Patient presents with  . Chest Pain    HISTORY OF PRESENT ILLNESS:  Bethany Maxwell  is a 71 y.o. female who presents with Left shoulder and left-sided chest pressure. Patient starts this has been going on since yesterday afternoon. Initially was intermittent, but by the evening he became persistent and constant. She came to the ED when she was getting ready to go to bed, the pain was not letting up. Here in the ED she was found to have a negative initial troponin. She states that she had been worked up recently and cardiology clinic and had an abnormal EKG. She was scheduled for outpatient stress test, but this has not occurred yet. Hospitalists were called for admission and further evaluation.  PAST MEDICAL HISTORY:   Past Medical History:  Diagnosis Date  . Asthma   . Atrial fibrillation (HCC)   . GERD (gastroesophageal reflux disease)   . Hypertension   . IBS (irritable bowel syndrome)   . Osteoarthritis     PAST SURGICAL HISTORY:   Past Surgical History:  Procedure Laterality Date  . BREAST BIOPSY Right    neg- needle bx-no scar  . CHOLECYSTECTOMY    . GASTRIC BYPASS  2008  . KNEE RECONSTRUCTION     bilat    SOCIAL HISTORY:   Social History  Substance Use Topics  . Smoking status: Never Smoker  . Smokeless tobacco: Never Used  . Alcohol use No    FAMILY HISTORY:   Family History  Problem Relation Age of Onset  . Breast cancer Maternal Aunt 75  . Breast cancer Cousin        3 cousins both sides  . Breast cancer Paternal Aunt     DRUG ALLERGIES:   Allergies  Allergen Reactions  . Cyclobenzaprine Hives  . Penicillins Swelling    MEDICATIONS AT HOME:   Prior  to Admission medications   Medication Sig Start Date End Date Taking? Authorizing Provider  azithromycin (ZITHROMAX) 250 MG tablet Take 1 tablet (250 mg total) by mouth daily. Take first 2 tablets together, then 1 every day until finished. 05/01/16   Lucia EstelleZheng, Feng, NP  fluticasone (FLONASE) 50 MCG/ACT nasal spray Place 1 spray into both nostrils daily.    [provider]  magnesium oxide (MAG-OX) 400 MG tablet Take 400 mg by mouth daily.    [provider]  metoprolol (LOPRESSOR) 50 MG tablet Take 50 mg by mouth 2 (two) times daily.    [provider]  Multiple Vitamin (MULTIVITAMIN) tablet Take 1 tablet by mouth daily.    [provider]  omeprazole (PRILOSEC) 20 MG capsule Take 20 mg by mouth daily.    [provider]  warfarin (COUMADIN) 3 MG tablet Take 3 mg by mouth daily.    [provider]    REVIEW OF SYSTEMS:  Review of Systems  Constitutional: Negative for chills, fever, malaise/fatigue and weight loss.  HENT: Negative for ear pain, hearing loss and tinnitus.   Eyes: Negative for blurred vision, double vision, pain and redness.  Respiratory: Negative for cough, hemoptysis and shortness of breath.   Cardiovascular: Positive for chest pain. Negative for palpitations, orthopnea and leg swelling.  Gastrointestinal: Negative for abdominal pain, constipation, diarrhea, nausea and vomiting.  Genitourinary: Negative for dysuria, frequency and hematuria.  Musculoskeletal: Negative for back pain, joint pain and neck pain.  Skin:       No acne, rash, or lesions  Neurological: Negative for dizziness, tremors, focal weakness and weakness.  Endo/Heme/Allergies: Negative for polydipsia. Does not bruise/bleed easily.  Psychiatric/Behavioral: Negative for depression. The patient is not nervous/anxious and does not have insomnia.      VITAL SIGNS:   Vitals:   12/11/16 2357 12/12/16 0010 12/12/16 0100  BP:  (!) 189/90 (!) 197/96  Pulse:  (!)  52 (!) 51  Resp:  16 (!) 21  Temp:  (!) 97.5 F (36.4 C)   TempSrc:  Oral   SpO2:  97% 97%  Weight: 83.9 kg (185 lb) 83.9 kg (185 lb)   Height: 5\' 2"  (1.575 m) 5\' 2"  (1.575 m)    Wt Readings from Last 3 Encounters:  12/12/16 83.9 kg (185 lb)  05/01/16 83.9 kg (185 lb)    PHYSICAL EXAMINATION:  Physical Exam  Vitals reviewed. Constitutional: She is oriented to person, place, and time. She appears well-developed and well-nourished. No distress.  HENT:  Head: Normocephalic and atraumatic.  Mouth/Throat: Oropharynx is clear and moist.  Eyes: Pupils are equal, round, and reactive to light. Conjunctivae and EOM are normal. No scleral icterus.  Neck: Normal range of motion. Neck supple. No JVD present. No thyromegaly present.  Cardiovascular: Normal rate, regular rhythm and intact distal pulses.  Exam reveals no gallop and no friction rub.   No murmur heard. Respiratory: Effort normal and breath sounds normal. No respiratory distress. She has no wheezes. She has no rales.  GI: Soft. Bowel sounds are normal. She exhibits no distension. There is no tenderness.  Musculoskeletal: Normal range of motion. She exhibits no edema.  No arthritis, no gout  Lymphadenopathy:    She has no cervical adenopathy.  Neurological: She is alert and oriented to person, place, and time. No cranial nerve deficit.  No dysarthria, no aphasia  Skin: Skin is warm and dry. No rash noted. No erythema.  Psychiatric: She has a normal mood and affect. Her behavior is normal. Judgment and thought content normal.    LABORATORY PANEL:   CBC  Recent Labs Lab 12/12/16 0020  WBC 7.4  HGB 12.7  HCT 37.9  PLT 359   ------------------------------------------------------------------------------------------------------------------  Chemistries   Recent Labs Lab 12/12/16 0020  NA 139  K 4.3  CL 105  CO2 27  GLUCOSE 116*  BUN 9  CREATININE 0.63  CALCIUM 9.2    ------------------------------------------------------------------------------------------------------------------  Cardiac Enzymes  Recent Labs Lab 12/12/16 0020  TROPONINI <0.03   ------------------------------------------------------------------------------------------------------------------  RADIOLOGY:  Dg Chest 2 View  Result Date: 12/12/2016 CLINICAL DATA:  Acute onset of left-sided chest pressure. Initial encounter. EXAM: CHEST  2 VIEW COMPARISON:  None. FINDINGS: There is elevation of the right hemidiaphragm. Mild bibasilar atelectasis or scarring is noted. Mild vascular congestion is noted. There is no evidence of pleural effusion or pneumothorax. The heart is normal in size; the mediastinal contour is within normal limits. No acute osseous abnormalities are seen. IMPRESSION: Elevation of the right hemidiaphragm. Mild bibasilar atelectasis or scarring noted. Mild vascular congestion. Electronically Signed   By: Roanna Raider M.D.   On: 12/12/2016 00:50    EKG:   Orders placed or performed during the hospital encounter of 12/12/16  . ED EKG  . ED EKG within 10 minutes  . ED  EKG  . ED EKG within 10 minutes    IMPRESSION AND PLAN:  Principal Problem:   Chest pain - cycle cardiac enzymes, get an echocardiogram and cardiology consult as well as stress test Active Problems:   AF (paroxysmal atrial fibrillation) (HCC) - continue home meds including anticoagulation   HTN (hypertension) - continue home medications   GERD (gastroesophageal reflux disease) - home dose PPI  All the records are reviewed and case discussed with ED provider. Management plans discussed with the patient and/or family.  DVT PROPHYLAXIS: Systemic anticoagulation  GI PROPHYLAXIS: PPI  ADMISSION STATUS: Observation  CODE STATUS: Full Code Status History    This patient does not have a recorded code status. Please follow your organizational policy for patients in this situation.      TOTAL  TIME TAKING CARE OF THIS PATIENT: 40 minutes.   Ember Henrikson FIELDING 12/12/2016, 1:37 AM  [employer]  [phone]  CC: Primary care physician; Mickey Farber, MD  Note:  This document was prepared using Dragon voice recognition software and may include unintentional dictation errors.

## 2016-12-12 NOTE — ED Provider Notes (Signed)
[facility] Emergency Department Provider Note   ____________________________________________   First MD Initiated Contact with Patient 12/12/16 [phone]     (approximate)  I have reviewed the triage vital signs and the nursing notes.   HISTORY  Chief Complaint Chest Pain    HPI Bethany Maxwell is a 71 y.o. female who presents to the ED from home with a chief complaint of chest pain. Patient has a history of atrial fibrillation on Coumadin who recently wore a Holter monitor last month for palpitations. She had a recent visits at the cardiologist's office with abnormal EKG and scheduled for stress echocardiogram in September. This evening patient experienced left sided chest pressure not associated with diaphoresis,shortness of breath, nausea/vomiting or palpitations. She did note that her blood pressure was higher than usual at home.Family history of multiple female family members who have died in their sleep secondary to heart disease. Denies recent fever, chills, abdominal pain, dysuria, diarrhea. Denies recent travel or trauma. Nothing makes her symptoms better or worse.   Past Medical History:  Diagnosis Date  . Asthma   . Atrial fibrillation (HCC)   . Hypertension   . IBS (irritable bowel syndrome)   . Osteoarthritis     There are no active problems to display for this patient.   Past Surgical History:  Procedure Laterality Date  . BREAST BIOPSY Right    neg- needle bx-no scar  . CHOLECYSTECTOMY    . GASTRIC BYPASS  2008  . KNEE RECONSTRUCTION     bilat    Prior to Admission medications   Medication Sig Start Date End Date Taking? Authorizing Provider  azithromycin (ZITHROMAX) 250 MG tablet Take 1 tablet (250 mg total) by mouth daily. Take first 2 tablets together, then 1 every day until finished. 05/01/16   Lucia Estelle, NP  fluticasone (FLONASE) 50 MCG/ACT nasal spray Place 1 spray into both nostrils daily.    [provider]    magnesium oxide (MAG-OX) 400 MG tablet Take 400 mg by mouth daily.    [provider]  metoprolol (LOPRESSOR) 50 MG tablet Take 50 mg by mouth 2 (two) times daily.    [provider]  Multiple Vitamin (MULTIVITAMIN) tablet Take 1 tablet by mouth daily.    [provider]  omeprazole (PRILOSEC) 20 MG capsule Take 20 mg by mouth daily.    [provider]  warfarin (COUMADIN) 3 MG tablet Take 3 mg by mouth daily.    [provider]    Allergies Cyclobenzaprine and Penicillins  Family History  Problem Relation Age of Onset  . Breast cancer Maternal Aunt 75  . Breast cancer Cousin        3 cousins both sides  . Breast cancer Paternal Aunt     Social History Social History  Substance Use Topics  . Smoking status: Never Smoker  . Smokeless tobacco: Never Used  . Alcohol use No    Review of Systems  Constitutional: No fever/chills. Eyes: No visual changes. ENT: No sore throat. Cardiovascular: Positive for chest pain. Respiratory: Denies shortness of breath. Gastrointestinal: No abdominal pain.  No nausea, no vomiting.  No diarrhea.  No constipation. Genitourinary: Negative for dysuria. Musculoskeletal: Negative for back pain. Skin: Negative for rash. Neurological: Negative for headaches, focal weakness or numbness.   ____________________________________________   PHYSICAL EXAM:  VITAL SIGNS: ED Triage Vitals  Enc Vitals Group     BP 12/12/16 0010 (!) 189/90     Pulse Rate 12/12/16  0010 (!) 52     Resp 12/12/16 0010 16     Temp 12/12/16 0010 (!) 97.5 F (36.4 C)     Temp Source 12/12/16 0010 Oral     SpO2 12/12/16 0010 97 %     Weight 12/11/16 2357 185 lb (83.9 kg)     Height 12/11/16 2357 5\' 2"  (1.575 m)     Head Circumference --      Peak Flow --      Pain Score 12/11/16 2357 6     Pain Loc --      Pain Edu? --      Excl. in GC? --     Constitutional: Alert and oriented. Well appearing and in no acute  distress. Eyes: Conjunctivae are normal. PERRL. EOMI. Head: Atraumatic. Nose: No congestion/rhinnorhea. Mouth/Throat: Mucous membranes are moist.  Oropharynx non-erythematous. Neck: No stridor.  No carotid bruits. Cardiovascular: Normal rate, regular rhythm. Grossly normal heart sounds.  Good peripheral circulation. Respiratory: Normal respiratory effort.  No retractions. Lungs CTAB. Gastrointestinal: Soft and nontender. No distention. No abdominal bruits. No CVA tenderness. Musculoskeletal: No lower extremity tenderness nor edema.  No joint effusions. Neurologic:  Normal speech and language. No gross focal neurologic deficits are appreciated.  Skin:  Skin is warm, dry and intact. No rash noted. Psychiatric: Mood and affect are normal. Speech and behavior are normal.  ____________________________________________   LABS (all labs ordered are listed, but only abnormal results are displayed)  Labs Reviewed  BASIC METABOLIC PANEL - Abnormal; Notable for the following:       Result Value   Glucose, Bld 116 (*)    All other components within normal limits  CBC - Abnormal; Notable for the following:    RDW 15.9 (*)    All other components within normal limits  TROPONIN I  PROTIME-INR   ____________________________________________  EKG  ED ECG REPORT I, SUNG,JADE J, the attending physician, personally viewed and interpreted this ECG.   Date: 12/12/2016  EKG Time: 0006  Rate: 55  Rhythm: sinus bradycardia  Axis: LAD  Intervals:none  ST&T Change: New T wave inversion lead III compared to 2016  ____________________________________________  RADIOLOGY  Dg Chest 2 View  Result Date: 12/12/2016 CLINICAL DATA:  Acute onset of left-sided chest pressure. Initial encounter. EXAM: CHEST  2 VIEW COMPARISON:  None. FINDINGS: There is elevation of the right hemidiaphragm. Mild bibasilar atelectasis or scarring is noted. Mild vascular congestion is noted. There is no evidence of pleural  effusion or pneumothorax. The heart is normal in size; the mediastinal contour is within normal limits. No acute osseous abnormalities are seen. IMPRESSION: Elevation of the right hemidiaphragm. Mild bibasilar atelectasis or scarring noted. Mild vascular congestion. Electronically Signed   By: Roanna Raider M.D.   On: 12/12/2016 00:50    ____________________________________________   PROCEDURES  Procedure(s) performed: None  Procedures  Critical Care performed: Yes, see critical care note(s)   CRITICAL CARE Performed by: Irean Hong   Total critical care time: 30 minutes  Critical care time was exclusive of separately billable procedures and treating other patients.  Critical care was necessary to treat or prevent imminent or life-threatening deterioration.  Critical care was time spent personally by me on the following activities: development of treatment plan with patient and/or surrogate as well as nursing, discussions with consultants, evaluation of patient's response to treatment, examination of patient, obtaining history from patient or surrogate, ordering and performing treatments and interventions, ordering and review of laboratory studies, ordering and  review of radiographic studies, pulse oximetry and re-evaluation of patient's condition.  ____________________________________________   INITIAL IMPRESSION / ASSESSMENT AND PLAN / ED COURSE  Pertinent labs & imaging results that were available during my care of the patient were reviewed by me and considered in my medical decision making (see chart for details).  71 year old female with atrial fibrillation on Coumadin with recent abnormal EKG a cardiology office who presents with waxing/waning left-sided chest pressure and hypertension. Symptoms concerning for unstable angina. T-wave inversion in lead 3 on EKG which is new from 2016. ASA and nitroglycerin paste applied. Will discuss with hospitalist to evaluate patient in the  emergency department for admission.      ____________________________________________   FINAL CLINICAL IMPRESSION(S) / ED DIAGNOSES  Final diagnoses:  Chest pain, unspecified type  Essential hypertension  Unstable angina (HCC)      NEW MEDICATIONS STARTED DURING THIS VISIT:  New Prescriptions   No medications on file     Note:  This document was prepared using Dragon voice recognition software and may include unintentional dictation errors.    Irean HongSung, Jade J, MD 12/12/16 [phone]

## 2016-12-12 NOTE — Progress Notes (Signed)
ANTICOAGULATION CONSULT NOTE - Initial Consult  Pharmacy Consult for warfarin dosing Indication: atrial fibrillation  Allergies  Allergen Reactions  . Clarithromycin Hives  . Cyclobenzaprine Other (See Comments)    "makes me loopy"  . Penicillins Swelling    Has patient had a PCN reaction causing immediate rash, facial/tongue/throat swelling, SOB or lightheadedness with hypotension: Yes Has patient had a PCN reaction causing severe rash involving mucus membranes or skin necrosis: No Has patient had a PCN reaction that required hospitalization: No Has patient had a PCN reaction occurring within the last 10 years: No If all of the above answers are "NO", then may proceed with Cephalosporin use.    Patient Measurements: Height: 5\' 2"  (157.5 cm) Weight: 186 lb 4.8 oz (84.5 kg) IBW/kg (Calculated) : 50.1 Heparin Dosing Weight: n/a  Vital Signs: Temp: 97.7 F (36.5 C) (08/13 0235) Temp Source: Oral (08/13 0235) BP: 160/77 (08/13 0235) Pulse Rate: 49 (08/13 0235)  Labs:  Recent Labs  12/12/16 0020 12/12/16 0104  HGB 12.7  --   HCT 37.9  --   PLT 359  --   LABPROT  --  33.2*  INR  --  3.17  CREATININE 0.63  --   TROPONINI <0.03  --     Estimated Creatinine Clearance: 67.9 mL/min (by C-G formula based on SCr of 0.63 mg/dL).   Medical History: Past Medical History:  Diagnosis Date  . Asthma   . Atrial fibrillation (HCC)   . GERD (gastroesophageal reflux disease)   . Hypertension   . IBS (irritable bowel syndrome)   . Osteoarthritis     Medications:  Home regimen warfarin 3 mg daily  Assessment: INR slightly supratherapeutic on admission.  Goal of Therapy:  INR 2-3 Monitor platelets by anticoagulation protocol: Yes   Plan:  Will hold warfarin for today. F/u INR tomorrow AM.  Osvaldo HumanMcBane,Copper Basnett S 12/12/2016,3:43 AM

## 2016-12-12 NOTE — Care Management Obs Status (Signed)
MEDICARE OBSERVATION STATUS NOTIFICATION   Patient Details  Name: Bethany PillingLinda G Maxwell MRN: [id number] Date of Birth: 1946-04-09   Medicare Observation Status Notification Given:  No < 24 hours   Eber HongGreene, Elsbeth Yearick R, RN 12/12/2016, 6:26 PM

## 2016-12-14 NOTE — Consult Note (Signed)
Reason for Consult: Chest pain possible angina Referring Physician: Dr. Mickey Farberavid Thies, Dr. Oralia Manisavid Willis hospitalist Cardiologist Dr. Myriam ForehandParaschos  Bethany Maxwell is an 71 y.o. female.  HPI: Patient presented with left shoulder left-sided chest pressure on an off since the day before admission patient initially had symptoms evening but persistent and constant came to emergency room for evaluation while she was traveling go to bed the pain did not abate. Patient was seen by cardiology Dr. Darrold JunkerParaschos shows and Leanora IvanoffAnna Drane NP for recent chest pain symptoms she was set up for outpatient functional study and echocardiogram but ended up being admitted here for further assessment and care. Patient has known atrial fibrillation on Coumadin for anticoagulation patient also has reflux symptoms. Denies any chest pain currently  Past Medical History:  Diagnosis Date  . Asthma   . Atrial fibrillation (HCC)   . GERD (gastroesophageal reflux disease)   . Hypertension   . IBS (irritable bowel syndrome)   . Osteoarthritis     Past Surgical History:  Procedure Laterality Date  . BREAST BIOPSY Right    neg- needle bx-no scar  . CHOLECYSTECTOMY    . GASTRIC BYPASS  2008  . KNEE RECONSTRUCTION     bilat    Family History  Problem Relation Age of Onset  . Breast cancer Maternal Aunt 75  . Breast cancer Cousin        3 cousins both sides  . Breast cancer Paternal Aunt     Social History:  reports that she has never smoked. She has never used smokeless tobacco. She reports that she does not drink alcohol or use drugs.  Allergies:  Allergies  Allergen Reactions  . Clarithromycin Hives  . Cyclobenzaprine Other (See Comments)    "makes me loopy"  . Penicillins Swelling    Has patient had a PCN reaction causing immediate rash, facial/tongue/throat swelling, SOB or lightheadedness with hypotension: Yes Has patient had a PCN reaction causing severe rash involving mucus membranes or skin necrosis: No Has  patient had a PCN reaction that required hospitalization: No Has patient had a PCN reaction occurring within the last 10 years: No If all of the above answers are "NO", then may proceed with Cephalosporin use.    Medications: I have reviewed the patient's current medications.  Results for orders placed or performed during the hospital encounter of 12/12/16 (from the past 48 hour(s))  Troponin I     Status: None   Collection Time: 12/12/16  1:31 PM  Result Value Ref Range   Troponin I <0.03 <0.03 ng/mL    No results found.  Review of Systems  Constitutional: Positive for malaise/fatigue.  HENT: Positive for congestion.   Eyes: Negative.   Respiratory: Positive for shortness of breath.   Cardiovascular: Positive for chest pain.  Gastrointestinal: Negative.   Genitourinary: Negative.   Musculoskeletal: Negative.   Skin: Negative.   Endo/Heme/Allergies: Negative.   Psychiatric/Behavioral: Negative.    Blood pressure 134/67, pulse (!) 47, temperature 97.7 F (36.5 C), temperature source Oral, resp. rate 18, height 5\' 2"  (1.575 m), weight 84.5 kg (186 lb 4.8 oz), SpO2 100 %. Physical Exam  Nursing note and vitals reviewed. Constitutional: She is oriented to person, place, and time. She appears well-developed and well-nourished.  HENT:  Head: Normocephalic and atraumatic.  Eyes: Pupils are equal, round, and reactive to light. Conjunctivae and EOM are normal.  Neck: Normal range of motion. Neck supple.  Cardiovascular: Normal rate, regular rhythm and normal heart sounds.  Respiratory: Effort normal and breath sounds normal.  GI: Soft.  Musculoskeletal: Normal range of motion.  Neurological: She is alert and oriented to person, place, and time. She has normal reflexes.  Skin: Skin is warm and dry.  Psychiatric: She has a normal mood and affect.    Assessment/Plan: Angina Chest pain Asthma Atrial fibrillation paroxysmal GERD Hypertension IBS Obesity mild . Plan Agree  rule out for myocardial infarction Continue telemetry follow-up EKGs troponins Continue medical therapy for now including Lopressor Recommend maintain anticoagulation for paroxysmal A. fib patient on Coumadin Continue Prilosec for GERD symptoms Agree with functional study prior to discharge for assessment of possible ischemia  Bethany Maxwell 12/14/2016, 1:06 PM

## 2016-12-15 LAB — NM MYOCAR MULTI W/SPECT W/WALL MOTION / EF
CHL CUP MPHR: 152 {beats}/min
CHL CUP STRESS STAGE 1 SPEED: 0 mph
CHL CUP STRESS STAGE 2 HR: 48 {beats}/min
CHL CUP STRESS STAGE 2 SPEED: 0 mph
CHL CUP STRESS STAGE 3 HR: 53 {beats}/min
CHL CUP STRESS STAGE 4 SPEED: 0 mph
CHL CUP STRESS STAGE 5 GRADE: 0 %
CSEPPMHR: 34 %
Estimated workload: 1 METS
Exercise duration (min): 1 min
Exercise duration (sec): 0 s
LV dias vol: 111 mL (ref 46–106)
LV sys vol: 38 mL
Peak HR: 53 {beats}/min
Percent HR: 53 %
Rest HR: 51 {beats}/min
SDS: 0
SRS: 8
SSS: 0
Stage 1 Grade: 0 %
Stage 1 HR: 47 {beats}/min
Stage 2 Grade: 0 %
Stage 3 Grade: 0 %
Stage 3 Speed: 0 mph
Stage 4 Grade: 0 %
Stage 4 HR: 82 {beats}/min
Stage 5 DBP: 53 mmHg
Stage 5 HR: 72 {beats}/min
Stage 5 SBP: 125 mmHg
Stage 5 Speed: 0 mph
TID: 1

## 2017-11-14 ENCOUNTER — Other Ambulatory Visit: Payer: Self-pay | Admitting: Internal Medicine

## 2017-11-14 DIAGNOSIS — Z1239 Encounter for other screening for malignant neoplasm of breast: Secondary | ICD-10-CM

## 2017-12-14 ENCOUNTER — Ambulatory Visit
Admission: RE | Admit: 2017-12-14 | Discharge: 2017-12-14 | Disposition: A | Discharge status: Home / Self Care | Payer: Medicare Other | Source: Ambulatory Visit | Attending: Internal Medicine | Admitting: Internal Medicine

## 2017-12-14 DIAGNOSIS — Z1239 Encounter for other screening for malignant neoplasm of breast: Secondary | ICD-10-CM

## 2017-12-14 DIAGNOSIS — Z1231 Encounter for screening mammogram for malignant neoplasm of breast: Secondary | ICD-10-CM | POA: Insufficient documentation

## 2018-02-12 ENCOUNTER — Encounter: Payer: Self-pay | Admitting: Occupational Therapy

## 2018-02-12 ENCOUNTER — Other Ambulatory Visit: Payer: Self-pay

## 2018-02-12 ENCOUNTER — Ambulatory Visit: Payer: Medicare Other | Attending: Orthopedic Surgery | Admitting: Occupational Therapy

## 2018-02-12 DIAGNOSIS — M79641 Pain in right hand: Secondary | ICD-10-CM | POA: Insufficient documentation

## 2018-02-12 DIAGNOSIS — M6281 Muscle weakness (generalized): Secondary | ICD-10-CM | POA: Diagnosis present

## 2018-02-12 DIAGNOSIS — M25641 Stiffness of right hand, not elsewhere classified: Secondary | ICD-10-CM | POA: Diagnosis present

## 2018-02-12 NOTE — Patient Instructions (Signed)
Moist heat in am and pm-  CMC neoprene splint issued for thumb pain  To use with act  And isotoner glove for L hand to use at night time - after moist heat   AROM tendon glides Opposition to all digits  pain free AROM   Joint protection principles review and hand out provided  And AE education done to decrease stress on joints

## 2018-02-12 NOTE — Therapy (Signed)
[place] [facility] [address], [place], [address] Phone: [phone]   Fax:  [phone]  Occupational Therapy Evaluation  Patient Details  Name: Bethany Maxwell MRN: [id number] Date of Birth: March 17, 1946 Referring Provider (OT): Rosita Kea   Encounter Date: 02/12/2018  OT End of Session - 02/12/18 2009    Visit Number  1    Number of Visits  3    Date for OT Re-Evaluation  03/12/18    OT Start Time  0845    OT Stop Time  0950    OT Time Calculation (min)  65 min    Activity Tolerance  Patient tolerated treatment well    Behavior During Therapy  [facility] for tasks assessed/performed       Past Medical History:  Diagnosis Date  . Asthma   . Atrial fibrillation (HCC)   . GERD (gastroesophageal reflux disease)   . Hypertension   . IBS (irritable bowel syndrome)   . Osteoarthritis     Past Surgical History:  Procedure Laterality Date  . BREAST BIOPSY Right    neg- needle bx-no scar  . CHOLECYSTECTOMY    . GASTRIC BYPASS  2008  . KNEE RECONSTRUCTION     bilat    There were no vitals filed for this visit.  Subjective Assessment - 02/12/18 1956    Subjective   My R hand pain and stiffness  got  gradually  worse - I do a lot of canning, freezing of veggies, cutting and peeling , taking care of my 70 yrs old granddaughter,  potty trng and pulling up and down pants , buttons and snaps     Patient Stated Goals  I want to avoid surgery and be able to use my hands as long as I can and protect if from getting worse     Currently in Pain?  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, own house work and Orthoptist Grip (lbs)  40   4/10 pain    Right Hand Lateral Pinch  10 lbs    Right Hand 3 Point Pinch  11 lbs    Left Hand Grip (lbs)  40    Left Hand Lateral Pinch  12 lbs    Left Hand 3 Point Pinch  14 lbs      Right Hand AROM   R Thumb Opposition to Index  --   opposition to base of 5th    R Index  MCP 0-90  85 Degrees  R Index PIP 0-100  95 Degrees    R Long  MCP 0-90  90 Degrees    R Long PIP 0-100  100 Degrees    R Ring  MCP 0-90  90 Degrees    R Ring PIP 0-100  100 Degrees    R Little  MCP 0-90  100 Degrees    R Little PIP 0-100  90 Degrees       Paraffin done to bilateral hands- decrease pain afterwards- pt report husband has one - but they don't use it   pt ed on using it for pain and decrease stiffness in AM - prior to ROM  And evening for pain control   ed on HEP - hand out provided  On all   Moist heat in am and pm-  CMC neoprene splint issued for thumb pain  To use with act  And isotoner glove for L hand to use at night time - after moist heat   AROM tendon glides Opposition to all digits  pain free AROM   Joint protection principles review and hand out provided  And AE education done to decrease stress on joints             OT Education - 02/12/18 2009    Education Details  findings of eval and Homeprogram     Person(s) Educated  Patient    Methods  Explanation;Demonstration;Handout    Comprehension  Verbalized understanding;Returned demonstration       OT Short Term Goals - 02/12/18 2014      OT SHORT TERM GOAL #1   Title  Decrease pain on PRWHE by more than 20 points     Baseline  Pain on PRHWE at eval 40/50     Time  2    Period  Weeks     Status  New    Target Date  02/26/18        OT Long Term Goals - 02/12/18 2016      OT LONG TERM GOAL #1   Title  Pt to be independent in HEP to decrease pain and maintain ROM , increase grip and prehension in hands     Baseline  pain increase from 3-9/10 with use - AROM WFL but grip and prehension strength decrease in R hand compare to L - see flowsheet     Time  4    Period  Weeks    Status  New    Target Date  03/12/18      OT LONG TERM GOAL #2   Title  Pt to verbalize and demo 3 joint protection and AE to use and that she implement to increase ease on hands in ADL's and IADl's     Baseline  4    Time  4    Period  Weeks    Status  New    Target Date  03/12/18            Plan - 02/12/18 2010    Clinical Impression Statement  Pt present at OT eval with diagnosis of OA with R hand pain more than L hand - DIP's and thumb CMC on R worse of all joints - pt show increase pain between 3-9/10 pain -AROM WFL and strength but because of pain pt show decrease functional use - pt can benefit from OT services for education of splint use , joint protection and AE trng to decreas stress on joints -and decrease pain to increase and  maintain AROM and strength in R hand more than L hand -     Occupational performance deficits (Please refer to evaluation for details):  ADL's;IADL's;Play;Leisure    Rehab Potential  Fair    Current Impairments/barriers affecting progress:  chronic condition     OT Frequency  Biweekly    OT Duration  4 weeks    OT Treatment/Interventions  Self-care/ADL training;Paraffin;Moist Heat;Patient/family education;Splinting;Therapeutic exercise;Manual Therapy;DME and/or AE instruction    Plan  assess progress and decrease pain with use of homeprogram     Clinical Decision Making  Limited treatment options, no task modification necessary    OT Home Exercise Plan  see pt instruction     Consulted and Agree with Plan of Care  Patient       Patient will benefit from  skilled therapeutic intervention in order to improve the following deficits and impairments:  Pain, Decreased knowledge of use of DME, Decreased coordination, Impaired UE functional use, Decreased range of motion, Decreased strength  Visit Diagnosis: Pain in right hand - Plan: Ot plan of care cert/re-cert  Stiffness of right hand, not elsewhere classified - Plan: Ot plan of care cert/re-cert  Muscle weakness (generalized) - Plan: Ot plan of care cert/re-cert    Problem List Patient Active Problem List   Diagnosis Date Noted  . Chest pain 12/12/2016  . AF (paroxysmal atrial fibrillation) (HCC) 12/12/2016  . HTN (hypertension) 12/12/2016  . GERD (gastroesophageal reflux disease) 12/12/2016    Oletta Cohn  OTR/L,CLT  02/12/2018, 8:33 PM   [facility] [address], [place], [address] Phone: [phone]   Fax:  [phone]  Name: MONTEZ STRYKER MRN: [id number] Date of Birth: 03-07-1946

## 2018-02-26 ENCOUNTER — Ambulatory Visit: Payer: Medicare Other | Admitting: Occupational Therapy

## 2018-02-26 DIAGNOSIS — M79641 Pain in right hand: Secondary | ICD-10-CM | POA: Diagnosis not present

## 2018-02-26 DIAGNOSIS — M25641 Stiffness of right hand, not elsewhere classified: Secondary | ICD-10-CM

## 2018-02-26 DIAGNOSIS — M6281 Muscle weakness (generalized): Secondary | ICD-10-CM

## 2018-02-26 NOTE — Therapy (Signed)
[place] [facility] [address], [place], [address] Phone: [phone]   Fax:  [phone]  Occupational Therapy Treatment/dicharge  Patient Details  Name: Bethany Maxwell MRN: [id number] Date of Birth: Sep 01, 1945 Referring Provider (OT): Rudene Christians   Encounter Date: 02/26/2018  OT End of Session - 02/26/18 1027    Visit Number  2    Number of Visits  2    Date for OT Re-Evaluation  02/26/18       Past Medical History:  Diagnosis Date  . Asthma   . Atrial fibrillation ([place])   . GERD (gastroesophageal reflux disease)   . Hypertension   . IBS (irritable bowel syndrome)   . Osteoarthritis     Past Surgical History:  Procedure Laterality Date  . BREAST BIOPSY Right    neg- needle bx-no scar  . CHOLECYSTECTOMY    . GASTRIC BYPASS  2008  . KNEE RECONSTRUCTION     bilat    There were no vitals filed for this visit.  Subjective Assessment - 02/26/18 1023    Subjective   I used my husband paraffin bath some this week or 2 - cannot do in the am - but my daughter is changing jobs then I can get to do it in the am - and I asked my son the other day to chop all the veggies for his chow he wanted to make -and use the food processor - I did order some of the adaptive equipment     Patient Stated Goals  I want to avoid surgery and be able to use my hands as long as I can and protect if from getting worse     Currently in Pain?  Yes    Pain Score  3    pain increase to 7/10 - but less than 25% of time    Pain Location  Hand    Pain Orientation  Right    Pain Descriptors / Indicators  Aching;Tightness    Pain Type  Chronic pain    Pain Onset  More than a month ago    Pain Frequency  Intermittent         [facility] OT Assessment - 02/26/18 0001      Strength   Right Hand Grip (lbs)  52   4/10 pain    Right Hand Lateral Pinch  14 lbs    Right Hand 3 Point Pinch  15 lbs    Left Hand Grip (lbs)  40    Left Hand Lateral  Pinch  12 lbs    Left Hand 3 Point Pinch  12 lbs      Right Hand AROM   R Index  MCP 0-90  80 Degrees    R Index PIP 0-100  100 Degrees    R Long  MCP 0-90  90 Degrees    R Long PIP 0-100  100 Degrees    R Ring  MCP 0-90  90 Degrees    R Ring PIP 0-100  100 Degrees    R Little  MCP 0-90  100 Degrees    R Little PIP 0-100  95 Degrees      Great progressing in grip and prehension -see flow sheet  AROM improve to - and less stiffness  Also reintegrade using paraffin bath and neoprene splint for CMC -to decrease pain  So can maintain her ROM - and progress in grip /prehension  OT Treatments/Exercises (OP) - 02/26/18 0001      RUE Paraffin   Number Minutes Paraffin  10 Minutes    RUE Paraffin Location  Hand    Comments  prior to soft tissue mobs  and review of ROM       LUE Paraffin   Number Minutes Paraffin  10 Minutes    LUE Paraffin Location  Hand    Comments  prior to soft tissue and review of HEP       soft tissue mobs done to bilateral hands - MC and carpal spreads  Prior to review of tendon glides And opposition   Review again joint protection and AE-  Pt did order some AE  And implementing some of using larger joint and avoid tight and prolonged grips        OT Education - 02/26/18 1026    Education Details  progress -and reinforce to use paraffin , CMC neoprenes and joint protection to decrease pain and maintain motion and strength     Person(s) Educated  Patient    Methods  Explanation;Demonstration;Handout    Comprehension  Verbalized understanding;Returned demonstration       OT Short Term Goals - 02/26/18 1030      OT SHORT TERM GOAL #1   Title  Decrease pain on PRWHE by more than 20 points     Baseline  Pain on PRHWE at eval 40/50  and now 26/50     Status  Partially Met        OT Long Term Goals - 02/26/18 1030      OT LONG TERM GOAL #1   Title  Pt to be independent in HEP to decrease pain and maintain ROM , increase grip and  prehension in hands     Status  Achieved      OT LONG TERM GOAL #2   Title  Pt to verbalize and demo 3 joint protection and AE to use and that she implement to increase ease on hands in ADL's and IADl's     Status  Achieved            Plan - 02/26/18 1028    Clinical Impression Statement  Pt made great progress in grip and prehension strength in R hand - cont to have pain between 0-7/10 - but less than 25% of time - reinforce importance to use her paraffin bath and  cmc neoprene splint to decrease pain -and cont with joint protection - and then she did order some AE to make things easier on ther hands-  pt independent in homeprogram - discharge at this time     Occupational performance deficits (Please refer to evaluation for details):  ADL's;IADL's;Play;Leisure    Current Impairments/barriers affecting progress:  chronic condition     OT Treatment/Interventions  Self-care/ADL training;Paraffin;Moist Heat;Patient/family education;Splinting;Therapeutic exercise;Manual Therapy;DME and/or AE instruction    Plan  discharge with homeprogram     Clinical Decision Making  Limited treatment options, no task modification necessary    OT Home Exercise Plan  see pt instruction     Consulted and Agree with Plan of Care  Patient       Patient will benefit from skilled therapeutic intervention in order to improve the following deficits and impairments:     Visit Diagnosis: Pain in right hand  Stiffness of right hand, not elsewhere classified  Muscle weakness (generalized)    Problem List Patient Active Problem List   Diagnosis Date Noted  . Chest  pain 12/12/2016  . AF (paroxysmal atrial fibrillation) (HCC) 12/12/2016  . HTN (hypertension) 12/12/2016  . GERD (gastroesophageal reflux disease) 12/12/2016    DuPreez, Maureen OTR/L,CLT  02/26/2018, 10:31 AM  [place] [facility] [address] [place], [place], [address] Phone:  [phone]   Fax:  [phone]  Name: Bethany Maxwell MRN: [id number] Date of Birth: 09/20/1945 

## 2018-02-26 NOTE — Patient Instructions (Signed)
Same

## 2018-11-19 ENCOUNTER — Other Ambulatory Visit: Payer: Self-pay | Admitting: Internal Medicine

## 2018-11-19 DIAGNOSIS — Z1231 Encounter for screening mammogram for malignant neoplasm of breast: Secondary | ICD-10-CM

## 2018-12-17 ENCOUNTER — Ambulatory Visit
Admission: RE | Admit: 2018-12-17 | Discharge: 2018-12-17 | Disposition: A | Discharge status: Home / Self Care | Payer: Medicare Other | Source: Ambulatory Visit | Attending: Internal Medicine | Admitting: Internal Medicine

## 2018-12-17 ENCOUNTER — Other Ambulatory Visit: Payer: Self-pay

## 2018-12-17 DIAGNOSIS — Z1231 Encounter for screening mammogram for malignant neoplasm of breast: Secondary | ICD-10-CM | POA: Insufficient documentation

## 2018-12-30 ENCOUNTER — Other Ambulatory Visit: Payer: Self-pay

## 2018-12-30 ENCOUNTER — Ambulatory Visit
Admission: EM | Admit: 2018-12-30 | Discharge: 2018-12-30 | Disposition: A | Discharge status: Home / Self Care | Payer: Medicare Other | Attending: Emergency Medicine | Admitting: Emergency Medicine

## 2018-12-30 DIAGNOSIS — Z6379 Other stressful life events affecting family and household: Secondary | ICD-10-CM | POA: Diagnosis not present

## 2018-12-30 DIAGNOSIS — N390 Urinary tract infection, site not specified: Secondary | ICD-10-CM | POA: Diagnosis not present

## 2018-12-30 LAB — URINALYSIS, COMPLETE (UACMP) WITH MICROSCOPIC
Bacteria, UA: NONE SEEN
Bilirubin Urine: NEGATIVE
Glucose, UA: NEGATIVE mg/dL
Hgb urine dipstick: NEGATIVE
Ketones, ur: NEGATIVE mg/dL
Nitrite: NEGATIVE
Protein, ur: NEGATIVE mg/dL
Specific Gravity, Urine: 1.015 (ref 1.005–1.030)
pH: 7 (ref 5.0–8.0)

## 2018-12-30 MED ORDER — NITROFURANTOIN MONOHYD MACRO 100 MG PO CAPS: 100.0000 mg | ORAL_CAPSULE | Freq: Two times a day (BID) | ORAL | 0 refills | Status: AC

## 2018-12-30 NOTE — ED Triage Notes (Signed)
Patient complains of urinary frequency, urgency, chills and low back pain. Patient states that she noticed her symptoms yesterday.

## 2018-12-30 NOTE — Discharge Instructions (Addendum)
It was very nice seeing you today in clinic. Thank you for entrusting me with your care.   As discussed, your urine is  shows early infection. Will approach treatment as follows:  Prescription has been sent to your pharmacy for antibiotics.  Please pick up and take as directed. FINISH the entire course of medication even if you are feeling better.  A culture will be sent on your provided sample. If it comes back resistant to what I have prescribed you, someone will call you and let you know that we will need to change antibiotics. Increase fluid intake as much as possible to flush your urinary tract.  Water is always the best.  Avoid caffeine until your infection clears up, as it can contribute to painful bladder spasms.  May use Tylenol and/or Ibuprofen as needed for pain/fever. May use Azo products (over the counter) to help with pain/discomfort.   Make arrangements to follow up with your regular doctor in 1 week for re-evaluation. If your symptoms/condition worsens, please seek follow up care either here or in the ER. Please remember, our [place] providers are "right here with you" when you need [place].   Again, it was my pleasure to take care of you today. Thank you for choosing our clinic. I hope that you start to feel better quickly.   Honor Loh, MSN, APRN, FNP-C, CEN Advanced Practice Provider [facility]

## 2018-12-30 NOTE — ED Provider Notes (Signed)
[place], [place]   Name: Bethany Maxwell DOB: 1946/03/19 MRN: [id number] CSN: [id number] PCP: Ezequiel Kayser, MD  Arrival date and time:  12/30/18 [id number]  Chief Complaint:  Urinary Frequency   NOTE: Prior to seeing the patient today, I have reviewed the triage nursing documentation and vital signs. Clinical staff has updated patient's PMH/PSHx, current medication list, and drug allergies/intolerances to ensure comprehensive history available to assist in medical decision making.   History:   HPI: Bethany Maxwell is a 73 y.o. female who presents today with complaints of urinary symptoms that began with acute onset yesterday. She complains of dysuria, frequency, and urgency. She has not appreciated any gross hematuria, nor has she noticed her urine being malodorous. Patient denies any associated nausea, vomiting, fever, but does note that she has some chills.  She has had pain in her lower back, but not flank or abdominal pain. PMH (+) for IBS that causes her to have episodes of fecal incontinence, which in turn results in frequent urinary tract infections. She denies any vaginal pain, bleeding, or discharge. Despite her symptoms, patient has not taken any over the counter interventions to help improve/relieve her reported symptoms at home. Patient has made efforts to increase her water intake since the onset of her symptoms.   Patient presents to clinic today with an elevated HR of 128. PMH for for paroxysmal A.fib. Patient states, "this always happens. It goes up and down when I get sick, don't drink enough, or is under a lot of stress". Patient advising that her husband is scheduled to have lung surgery this week and she "just has to be better". Patient denies any chest pain or shortness of breath. She advises that her cardiologist is aware of her rate and rhythm.    Past Medical History:  Diagnosis Date  . Asthma   . Atrial fibrillation ([place])   . GERD (gastroesophageal reflux disease)   .  Hypertension   . IBS (irritable bowel syndrome)   . Osteoarthritis     Past Surgical History:  Procedure Laterality Date  . BREAST BIOPSY Right    neg- needle bx-no scar  . CHOLECYSTECTOMY    . GASTRIC BYPASS  2008  . KNEE RECONSTRUCTION     bilat    Family History  Problem Relation Age of Onset  . Breast cancer Maternal Aunt 75  . Breast cancer Cousin        3 cousins both sides  . Breast cancer Paternal Aunt     Social History   Tobacco Use  . Smoking status: Never Smoker  . Smokeless tobacco: Never Used  Substance Use Topics  . Alcohol use: No  . Drug use: No    Patient Active Problem List   Diagnosis Date Noted  . Chest pain 12/12/2016  . AF (paroxysmal atrial fibrillation) ([place]) 12/12/2016  . HTN (hypertension) 12/12/2016  . GERD (gastroesophageal reflux disease) 12/12/2016    Home Medications:    Current Meds  Medication Sig  . acetaminophen (TYLENOL) 650 MG CR tablet Take 650 mg by mouth 2 (two) times daily.  . fluticasone (FLONASE) 50 MCG/ACT nasal spray Place 1 spray into both nostrils daily.  Marland Kitchen lisinopril (PRINIVIL,ZESTRIL) 5 MG tablet Take 1 tablet (5 mg total) by mouth daily.  . magnesium oxide (MAG-OX) 400 MG tablet Take 400 mg by mouth daily.  . metoprolol succinate (TOPROL-XL) 50 MG 24 hr tablet Take 50 mg by mouth 2 (two) times daily. Take with or immediately following a  meal.  . Multiple Vitamin (MULTIVITAMIN) tablet Take 1 tablet by mouth daily.  . multivitamin-lutein (OCUVITE-LUTEIN) CAPS capsule Take 1 capsule by mouth daily.  Marland Kitchen. omeprazole (PRILOSEC) 20 MG capsule Take 20 mg by mouth daily.  . Turmeric 500 MG CAPS Take 1 capsule by mouth daily.  Marland Kitchen. warfarin (COUMADIN) 3 MG tablet Take 3 mg by mouth daily.    Allergies:   Clarithromycin, Cyclobenzaprine, and Penicillins  Review of Systems (ROS): Review of Systems  Constitutional: Positive for appetite change (decreased) and chills. Negative for fever.  Respiratory: Negative for cough,  chest tightness and shortness of breath.   Cardiovascular: Positive for palpitations (A.fib). Negative for chest pain.  Gastrointestinal: Negative for abdominal pain, diarrhea, nausea and vomiting.       IBS  Genitourinary: Positive for dysuria, frequency and urgency. Negative for flank pain, hematuria, vaginal bleeding, vaginal discharge and vaginal pain.  Musculoskeletal: Positive for back pain.  Neurological: Negative for dizziness, syncope, weakness and headaches.  Psychiatric/Behavioral: The patient is nervous/anxious.   All other systems reviewed and are negative.    Vital Signs: Today's Vitals   12/30/18 0836 12/30/18 0837 12/30/18 0838 12/30/18 0904  BP:   (!) 123/91   Pulse:   (!) 128   Resp:   18   Temp:   98.4 F (36.9 C)   TempSrc:   Oral   SpO2:   99%   Weight:  185 lb (83.9 kg)    Height:  5\' 2"  (1.575 m)    PainSc: 0-No pain   0-No pain    Physical Exam: Physical Exam  Constitutional: She is oriented to person, place, and time and well-developed, well-nourished, and in no distress. No distress.  HENT:  Head: Normocephalic and atraumatic.  Nose: Nose normal.  Mouth/Throat: Oropharynx is clear and moist.  Eyes: Pupils are equal, round, and reactive to light. Conjunctivae and EOM are normal.  Neck: Normal range of motion. Neck supple. No tracheal deviation present.  Cardiovascular: Normal heart sounds and intact distal pulses. An irregularly irregular rhythm present. Tachycardia present. Exam reveals no gallop and no friction rub.  No murmur heard. Pulmonary/Chest: Effort normal and breath sounds normal. No respiratory distress. She has no wheezes. She has no rales.  Abdominal: Soft. Normal appearance and bowel sounds are normal. She exhibits no distension. There is abdominal tenderness in the suprapubic area. There is no CVA tenderness.  Musculoskeletal: Normal range of motion.  Lymphadenopathy:    She has no cervical adenopathy.  Neurological: She is alert and  oriented to person, place, and time. Gait normal. GCS score is 15.  Skin: Skin is warm and dry. No rash noted. She is not diaphoretic.  Psychiatric: Memory, affect and judgment normal. Her mood appears anxious.  Nursing note and vitals reviewed.   Urgent Care Treatments / Results:   LABS: PLEASE NOTE: all labs that were ordered this encounter are listed, however only abnormal results are displayed. Labs Reviewed  URINALYSIS, COMPLETE (UACMP) WITH MICROSCOPIC - Abnormal; Notable for the following components:      Result Value   Leukocytes,Ua TRACE (*)    All other components within normal limits  URINE CULTURE    EKG: -None  RADIOLOGY: No results found.  PROCEDURES: Procedures  MEDICATIONS RECEIVED THIS VISIT: Medications - No data to display  PERTINENT CLINICAL COURSE NOTES/UPDATES:   Initial Impression / Assessment and Plan / Urgent Care Course:  Pertinent labs & imaging results that were available during my care of the patient were  personally reviewed by me and considered in my medical decision making (see lab/imaging section of note for values and interpretations).  KALLIE DIETERICH is a 73 y.o. female who presents to [facility] today with complaints of urinary symptoms and back pain.   Patient is well appearing overall in clinic today. She does not appear to be in any acute distress. Presenting symptoms (see HPI) and exam as documented above. UA (+) for LE. Patient advising that she has had several infections recently and this feels the same. Given her chills, urinary symptoms, and back pain it is reasonable to proceed with treatment for urinary tract infection. Due to her A.fib, patient is on daily warfarin therapy. Will defer short course Bactrim and treat with a 5 day course of nitrofurantoin. Patient encouraged to complete the entire course of antibiotics even if she begins to feel better. She was advised that if culture demonstrates resistance to the prescribed  antibiotic, she will be contacted and advised of the need to change the antibiotic being used to treat her infection. Patient encouraged to increase her fluid intake as much as possible. Discussed that water is always best to flush the urinary tract. She was advised to avoid caffeine containing fluids until her infections clears, as caffeine can cause her to experience painful bladder spasms. May use Tylenol and/or Ibuprofen as needed for pain/fever. May also use over the counter phenazopyridine to help relieve her current urinary pain.   Regarding her anxiety. Patient's husband is facing major surgery this week. Patient is under a great deal of of anticipatory stress regarding the course of the procedure, as well as her husband's aftercare. She notes that she has a strong support system in place to help. Encouraged patient to utilize available resources to prevent caregiver role strain and episodes of increased anxiety. Discussed elevated BP and HR in clinic. HR reassessed in exam room by me and found to be in the low 100s, again with no associated CP or SOB. Patient has not taken her beta blocker (Toprol XL) yet this morning due to trying to get here and be seen. Patient advised to follow up with PCP for ongoing management.   Discussed follow up with primary care physician in 1 week for re-evaluation. I have reviewed the follow up and strict return precautions for any new or worsening symptoms. Patient is aware of symptoms that would be deemed urgent/emergent, and would thus require further evaluation either here or in the emergency department. At the time of discharge, she verbalized understanding and consent with the discharge plan as it was reviewed with her. All questions were fielded by provider and/or clinic staff prior to patient discharge.    Final Clinical Impressions / Urgent Care Diagnoses:   Final diagnoses:  Urinary tract infection without hematuria, site unspecified  Stress due to illness  of family member    New Prescriptions:  [place] Controlled Substance Registry consulted? Not Applicable  Meds ordered this encounter  Medications  . nitrofurantoin, macrocrystal-monohydrate, (MACROBID) 100 MG capsule    Sig: Take 1 capsule (100 mg total) by mouth 2 (two) times daily for 5 days.    Dispense:  10 capsule    Refill:  0    Recommended Follow up Care:  Patient encouraged to follow up with the following provider within the specified time frame, or sooner as dictated by the severity of her symptoms. As always, she was instructed that for any urgent/emergent care needs, she should seek care either here or  in the emergency department for more immediate evaluation.  Follow-up Information    Mickey Farberhies, David, MD In 1 week.   Specialty: Internal Medicine Why: General reassessment of symptoms if not improving Contact information: [address] [facility] [place] [id number] [phone]         NOTE: This note was prepared using Dragon dictation software along with smaller phrase technology. Despite my best ability to proofread, there is the potential that transcriptional errors may still occur from this process, and are completely unintentional.    Verlee MonteGray, Devaris Quirk E, NP 12/30/18 2352

## 2018-12-31 LAB — URINE CULTURE

## 2019-09-14 ENCOUNTER — Encounter: Payer: Self-pay | Admitting: Emergency Medicine

## 2019-09-14 ENCOUNTER — Emergency Department: Payer: Medicare PPO

## 2019-09-14 ENCOUNTER — Emergency Department
Admission: EM | Admit: 2019-09-14 | Discharge: 2019-09-14 | Disposition: A | Discharge status: Home / Self Care | Payer: Medicare PPO | Attending: Emergency Medicine | Admitting: Emergency Medicine

## 2019-09-14 ENCOUNTER — Other Ambulatory Visit: Payer: Self-pay

## 2019-09-14 DIAGNOSIS — Z79899 Other long term (current) drug therapy: Secondary | ICD-10-CM | POA: Insufficient documentation

## 2019-09-14 DIAGNOSIS — I10 Essential (primary) hypertension: Secondary | ICD-10-CM | POA: Insufficient documentation

## 2019-09-14 DIAGNOSIS — I4891 Unspecified atrial fibrillation: Secondary | ICD-10-CM | POA: Diagnosis not present

## 2019-09-14 DIAGNOSIS — R0602 Shortness of breath: Secondary | ICD-10-CM | POA: Diagnosis not present

## 2019-09-14 DIAGNOSIS — R000 Tachycardia, unspecified: Secondary | ICD-10-CM | POA: Diagnosis present

## 2019-09-14 DIAGNOSIS — Z7901 Long term (current) use of anticoagulants: Secondary | ICD-10-CM | POA: Diagnosis not present

## 2019-09-14 LAB — COMPREHENSIVE METABOLIC PANEL
ALT: 32 U/L (ref 0–44)
AST: 31 U/L (ref 15–41)
Albumin: 4.2 g/dL (ref 3.5–5.0)
Alkaline Phosphatase: 102 U/L (ref 38–126)
Anion gap: 8 (ref 5–15)
BUN: 9 mg/dL (ref 8–23)
CO2: 27 mmol/L (ref 22–32)
Calcium: 9.3 mg/dL (ref 8.9–10.3)
Chloride: 106 mmol/L (ref 98–111)
Creatinine, Ser: 0.6 mg/dL (ref 0.44–1.00)
GFR calc Af Amer: >60 mL/min (ref >60)
GFR calc non Af Amer: >60 mL/min (ref >60)
Glucose, Bld: 114 mg/dL — ABNORMAL HIGH (ref 70–99)
Potassium: 4.3 mmol/L (ref 3.5–5.1)
Sodium: 141 mmol/L (ref 135–145)
Total Bilirubin: 0.9 mg/dL (ref 0.3–1.2)
Total Protein: 7.7 g/dL (ref 6.5–8.1)

## 2019-09-14 LAB — CBC
HCT: 42.3 % (ref 36.0–46.0)
Hemoglobin: 14 g/dL (ref 12.0–15.0)
MCH: 28.9 pg (ref 26.0–34.0)
MCHC: 33.1 g/dL (ref 30.0–36.0)
MCV: 87.4 fL (ref 80.0–100.0)
Platelets: 369 10*3/uL (ref 150–400)
RBC: 4.84 MIL/uL (ref 3.87–5.11)
RDW: 14.5 % (ref 11.5–15.5)
WBC: 9.2 10*3/uL (ref 4.0–10.5)
nRBC: 0 % (ref 0.0–0.2)

## 2019-09-14 LAB — APTT: aPTT: 38 seconds — ABNORMAL HIGH (ref 24–36)

## 2019-09-14 LAB — PROTIME-INR
INR: 2.5 — ABNORMAL HIGH (ref 0.8–1.2)
Prothrombin Time: 26.3 seconds — ABNORMAL HIGH (ref 11.4–15.2)

## 2019-09-14 LAB — BRAIN NATRIURETIC PEPTIDE: B Natriuretic Peptide: 345 pg/mL — ABNORMAL HIGH (ref 0.0–100.0)

## 2019-09-14 LAB — TROPONIN I (HIGH SENSITIVITY): Troponin I (High Sensitivity): 5 ng/L (ref <18)

## 2019-09-14 MED ORDER — SODIUM CHLORIDE 0.9% FLUSH: 3.0000 mL | Freq: Once | INTRAVENOUS | Status: DC

## 2019-09-14 MED ORDER — DILTIAZEM HCL 25 MG/5ML IV SOLN
15.0000 mg | Freq: Once | INTRAVENOUS | Status: AC
  Administered 2019-09-14: 15 mg via INTRAVENOUS
  Filled 2019-09-14: qty 5

## 2019-09-14 MED ORDER — DILTIAZEM HCL 60 MG PO TABS
60.0000 mg | ORAL_TABLET | Freq: Once | ORAL | Status: AC
  Administered 2019-09-14: 60 mg via ORAL
  Filled 2019-09-14: qty 1

## 2019-09-14 NOTE — ED Notes (Signed)
[facility]OR[facility] for repeat blood work per Dr. Cyril Loosen.

## 2019-09-14 NOTE — ED Triage Notes (Signed)
Pt presents to ED via wheelchair from [facility], per EKG at [facility] pt in A-fib RVR with HR 135, pt states had ablation for A-fib on April 7 at [facility]. Pt reports increasing fatigue, and dyspnea with exertion. Pt presents A&O x4, skin warm, dry and intact.

## 2019-09-14 NOTE — ED Provider Notes (Signed)
[facility] Emergency Department Provider Note   ____________________________________________    I have reviewed the triage vital signs and the nursing notes.   HISTORY  Chief Complaint Tachycardia     HPI Bethany Maxwell is a 74 y.o. female with history of atrial fibrillation who presents with complaints of shortness of breath with exertion, palpitations.  Patient reports that she recently had a an ablation the beginning of April however was told that she would continue to have paroxysmal atrial fibrillation which she has had however they have been short episodes.  More recently she has had a longer episode of atrial fibrillation where she feels quite fatigued and short of breath with exertion which is more severe than typical.  Denies fevers or chills.  No chest pain.  No nausea or vomiting.  No diaphoresis.  Takes metoprolol and diltiazem and is compliant.  Dr. Saralyn Pilar is her cardiologist  Past Medical History:  Diagnosis Date  . Asthma   . Atrial fibrillation ([place])   . GERD (gastroesophageal reflux disease)   . Hypertension   . IBS (irritable bowel syndrome)   . Osteoarthritis     Patient Active Problem List   Diagnosis Date Noted  . Chest pain 12/12/2016  . AF (paroxysmal atrial fibrillation) ([place]) 12/12/2016  . HTN (hypertension) 12/12/2016  . GERD (gastroesophageal reflux disease) 12/12/2016    Past Surgical History:  Procedure Laterality Date  . BREAST BIOPSY Right    neg- needle bx-no scar  . CHOLECYSTECTOMY    . GASTRIC BYPASS  2008  . KNEE RECONSTRUCTION     bilat    Prior to Admission medications   Medication Sig Start Date End Date Taking? Authorizing Provider  acetaminophen (TYLENOL) 650 MG CR tablet Take 650 mg by mouth 2 (two) times daily.    [provider]  fluticasone (FLONASE) 50 MCG/ACT nasal spray Place 1 spray into both nostrils daily.    [provider]  lisinopril (PRINIVIL,ZESTRIL) 5 MG  tablet Take 1 tablet (5 mg total) by mouth daily. 12/12/16 12/30/18  Vaughan Basta, MD  magnesium oxide (MAG-OX) 400 MG tablet Take 400 mg by mouth daily.    [provider]  metoprolol succinate (TOPROL-XL) 50 MG 24 hr tablet Take 50 mg by mouth 2 (two) times daily. Take with or immediately following a meal.    [provider]  Multiple Vitamin (MULTIVITAMIN) tablet Take 1 tablet by mouth daily.    [provider]  multivitamin-lutein (OCUVITE-LUTEIN) CAPS capsule Take 1 capsule by mouth daily.    [provider]  omeprazole (PRILOSEC) 20 MG capsule Take 20 mg by mouth daily.    [provider]  Turmeric 500 MG CAPS Take 1 capsule by mouth daily.    [provider]  warfarin (COUMADIN) 3 MG tablet Take 3 mg by mouth daily.    [provider]     Allergies Clarithromycin, Cyclobenzaprine, and Penicillins  Family History  Problem Relation Age of Onset  . Breast cancer Maternal Aunt 75  . Breast cancer Cousin        3 cousins both sides  . Breast cancer Paternal Aunt     Social History Social History   Tobacco Use  . Smoking status: Never Smoker  . Smokeless tobacco: Never Used  Substance Use Topics  . Alcohol use: No  . Drug use: No    Review of Systems  Constitutional: No fever/chills Eyes: No visual changes.  ENT: No sore throat.  Cardiovascular: Denies chest pain. Respiratory: Denies shortness of breath. Gastrointestinal: No abdominal pain.  No nausea, no vomiting.   Genitourinary: Negative for dysuria. Musculoskeletal: Negative for back pain. Skin: Negative for rash. Neurological: Negative for headaches or weakness   ____________________________________________   PHYSICAL EXAM:  VITAL SIGNS: ED Triage Vitals  Enc Vitals Group     BP 09/14/19 1209 (!) 143/95     Pulse Rate 09/14/19 1209 (!) 146     Resp 09/14/19 1209 (!) 24     Temp 09/14/19 1209 97.6 F (36.4 C)     Temp Source 09/14/19  1209 Oral     SpO2 09/14/19 1209 97 %     Weight 09/14/19 1210 83.9 kg (185 lb)     Height 09/14/19 1210 1.575 m (5\' 2" )     Head Circumference --      Peak Flow --      Pain Score 09/14/19 1210 0     Pain Loc --      Pain Edu? --      Excl. in GC? --     Constitutional: Alert and oriented.   Nose: No congestion/rhinnorhea. Mouth/Throat: Mucous membranes are moist.    Cardiovascular: Tachycardia, regular rhythm good peripheral circulation. Respiratory: Normal respiratory effort.  No retractions. Gastrointestinal: Soft and nontender. No distention.  No CVA tenderness.  Musculoskeletal: .  Warm and well perfused Neurologic:  Normal speech and language. No gross focal neurologic deficits are appreciated.  Skin:  Skin is warm, dry and intact. No rash noted. Psychiatric: Mood and affect are normal. Speech and behavior are normal.  ____________________________________________   LABS (all labs ordered are listed, but only abnormal results are displayed)  Labs Reviewed  COMPREHENSIVE METABOLIC PANEL - Abnormal; Notable for the following components:      Result Value   Glucose, Bld 114 (*)    All other components within normal limits  PROTIME-INR - Abnormal; Notable for the following components:   Prothrombin Time 26.3 (*)    INR 2.5 (*)    All other components within normal limits  APTT - Abnormal; Notable for the following components:   aPTT 38 (*)    All other components within normal limits  BRAIN NATRIURETIC PEPTIDE - Abnormal; Notable for the following components:   B Natriuretic Peptide 345.0 (*)    All other components within normal limits  CBC  TROPONIN I (HIGH SENSITIVITY)   ____________________________________________  EKG  ED ECG REPORT I, 09/16/19, the attending physician, personally viewed and interpreted this ECG.  Date: 09/14/2019  Rhythm: Atrial fibrillation QRS Axis: normal Intervals: Abnormal ST/T Wave abnormalities: normal Narrative  Interpretation: Atrial fibrillation with RVR  ____________________________________________  RADIOLOGY  Chest x-ray viewed by me, unremarkable ____________________________________________   PROCEDURES  Procedure(s) performed: yes  .1-3 Lead EKG Interpretation Performed by: 09/16/2019, MD Authorized by: Jene Every, MD     Interpretation: normal       Critical Care performed: yes  CRITICAL CARE Performed by: Jene Every   Total critical care time: 30 minutes  Critical care time was exclusive of separately billable procedures and treating other patients.  Critical care was necessary to treat or prevent imminent or life-threatening deterioration.  Critical care was time spent personally by me on the following activities: development of treatment plan with patient and/or surrogate as well as nursing, discussions with consultants, evaluation of patient's response to treatment, examination of patient, obtaining history from patient or surrogate, ordering and performing treatments and interventions, ordering and  review of laboratory studies, ordering and review of radiographic studies, pulse oximetry and re-evaluation of patient's condition.  ____________________________________________   INITIAL IMPRESSION / ASSESSMENT AND PLAN / ED COURSE  Pertinent labs & imaging results that were available during my care of the patient were reviewed by me and considered in my medical decision making (see chart for details).  Patient presents with atrial fibrillation with RVR, this is likely the cause of her fatigue with exertion.  She reports she feels well when she is not moving and rests.  No chest pain.  Is compliant with her medications.  We will give Cardizem 15 mg and reevaluate.  Significant improvement in heart rate after IV Cardizem.  Given 60 mg of p.o. Cardizem afterwards.  Had patient ambulate in the room and she felt quite well with heart rate staying under  120.  Her lab work is overall reassuring, troponin is normal.  INR is therapeutic.  Chest x-ray is without edema or infection.  Given that her heart rate has remained improved after treatment, appropriate for discharge with close follow-up with her cardiologist.  Strict return precautions discussed.  Patient agrees with plan.    ____________________________________________   FINAL CLINICAL IMPRESSION(S) / ED DIAGNOSES  Final diagnoses:  Atrial fibrillation with RVR (HCC)        Note:  This document was prepared using Dragon voice recognition software and may include unintentional dictation errors.   Jene Every, MD 09/14/19 [phone]

## 2019-09-14 NOTE — ED Notes (Signed)
Pt unhooked from monitoring equipment to use bathroom

## 2019-11-18 ENCOUNTER — Other Ambulatory Visit: Payer: Self-pay | Admitting: Internal Medicine

## 2019-11-18 DIAGNOSIS — Z1231 Encounter for screening mammogram for malignant neoplasm of breast: Secondary | ICD-10-CM

## 2019-12-18 ENCOUNTER — Other Ambulatory Visit: Payer: Self-pay

## 2019-12-18 ENCOUNTER — Ambulatory Visit
Admission: RE | Admit: 2019-12-18 | Discharge: 2019-12-18 | Disposition: A | Discharge status: Home / Self Care | Payer: Medicare PPO | Source: Ambulatory Visit | Attending: Internal Medicine | Admitting: Internal Medicine

## 2019-12-18 DIAGNOSIS — Z1231 Encounter for screening mammogram for malignant neoplasm of breast: Secondary | ICD-10-CM | POA: Insufficient documentation

## 2020-02-19 ENCOUNTER — Other Ambulatory Visit
Admission: RE | Admit: 2020-02-19 | Discharge: 2020-02-19 | Disposition: A | Discharge status: Home / Self Care | Payer: Medicare PPO | Source: Ambulatory Visit | Attending: Pulmonary Disease | Admitting: Pulmonary Disease

## 2020-02-19 DIAGNOSIS — R0600 Dyspnea, unspecified: Secondary | ICD-10-CM | POA: Insufficient documentation

## 2020-02-19 LAB — FIBRIN DERIVATIVES D-DIMER (ARMC ONLY): Fibrin derivatives D-dimer (ARMC): 514.19 ng/mL (FEU) — ABNORMAL HIGH (ref 0.00–499.00)

## 2020-02-20 ENCOUNTER — Ambulatory Visit
Admission: RE | Admit: 2020-02-20 | Discharge: 2020-02-20 | Disposition: A | Discharge status: Home / Self Care | Payer: Medicare PPO | Source: Ambulatory Visit | Attending: Pulmonary Disease | Admitting: Pulmonary Disease

## 2020-02-20 ENCOUNTER — Other Ambulatory Visit: Payer: Self-pay | Admitting: Pulmonary Disease

## 2020-02-20 ENCOUNTER — Other Ambulatory Visit: Payer: Self-pay

## 2020-02-20 DIAGNOSIS — R7989 Other specified abnormal findings of blood chemistry: Secondary | ICD-10-CM

## 2020-02-20 LAB — POCT I-STAT CREATININE: Creatinine, Ser: 0.7 mg/dL (ref 0.44–1.00)

## 2020-02-20 MED ORDER — IOHEXOL 350 MG/ML SOLN
75.0000 mL | Freq: Once | INTRAVENOUS | Status: AC | PRN
  Administered 2020-02-20: 75 mL via INTRAVENOUS

## 2020-02-21 ENCOUNTER — Other Ambulatory Visit: Payer: Self-pay | Admitting: Pulmonary Disease

## 2020-02-21 DIAGNOSIS — R7989 Other specified abnormal findings of blood chemistry: Secondary | ICD-10-CM

## 2020-11-17 ENCOUNTER — Other Ambulatory Visit: Payer: Self-pay | Admitting: Family Medicine

## 2020-11-17 DIAGNOSIS — Z1231 Encounter for screening mammogram for malignant neoplasm of breast: Secondary | ICD-10-CM

## 2020-12-21 ENCOUNTER — Ambulatory Visit
Admission: RE | Admit: 2020-12-21 | Discharge: 2020-12-21 | Disposition: A | Discharge status: Home / Self Care | Payer: Medicare PPO | Source: Ambulatory Visit | Attending: Family Medicine | Admitting: Family Medicine

## 2020-12-21 ENCOUNTER — Ambulatory Visit: Payer: Medicare PPO

## 2020-12-21 ENCOUNTER — Other Ambulatory Visit: Payer: Self-pay

## 2020-12-21 DIAGNOSIS — Z1231 Encounter for screening mammogram for malignant neoplasm of breast: Secondary | ICD-10-CM | POA: Insufficient documentation

## 2022-11-29 IMAGING — MG MM DIGITAL SCREENING BILAT W/ TOMO AND CAD
8 series · 8 of 24 positions shown · non-contrast
Comparison: Previous exam(s).

CLINICAL DATA: Screening.

EXAM:
DIGITAL SCREENING BILATERAL MAMMOGRAM WITH TOMOSYNTHESIS AND CAD
TECHNIQUE: Bilateral screening digital craniocaudal and mediolateral oblique
mammograms were obtained. Bilateral screening digital breast
tomosynthesis was performed. The images were evaluated with
computer-aided detection.

[R MLO synth-2D]
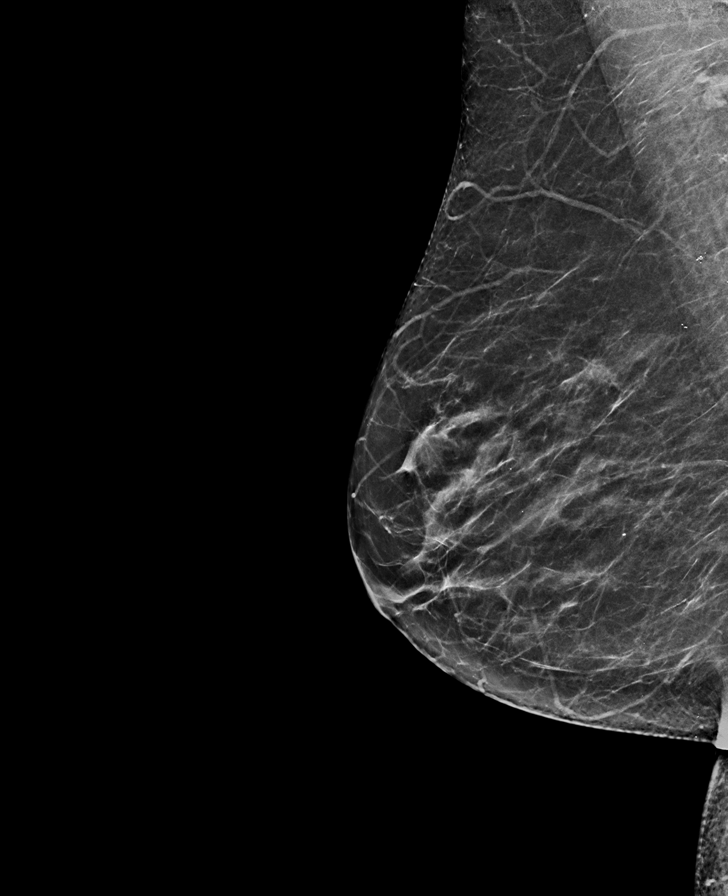

[L CC synth-2D]
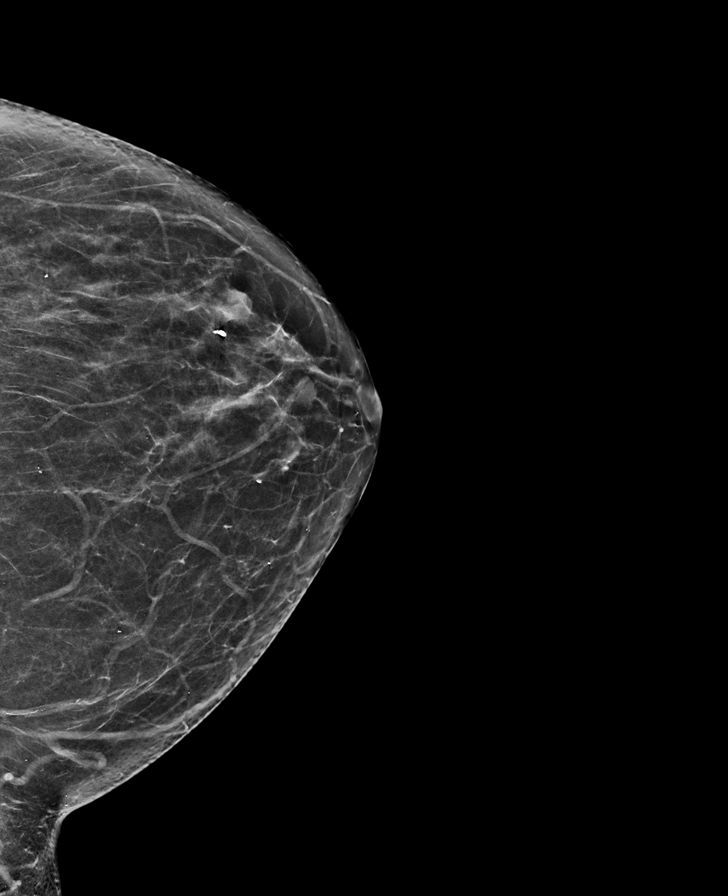

[L MLO synth-2D]
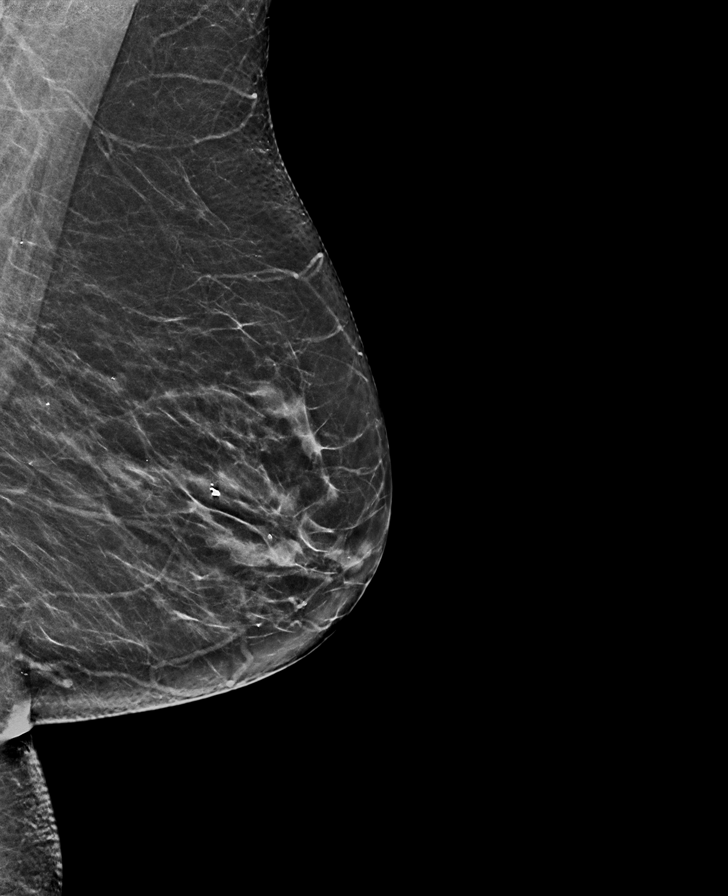

[R CC synth-2D]
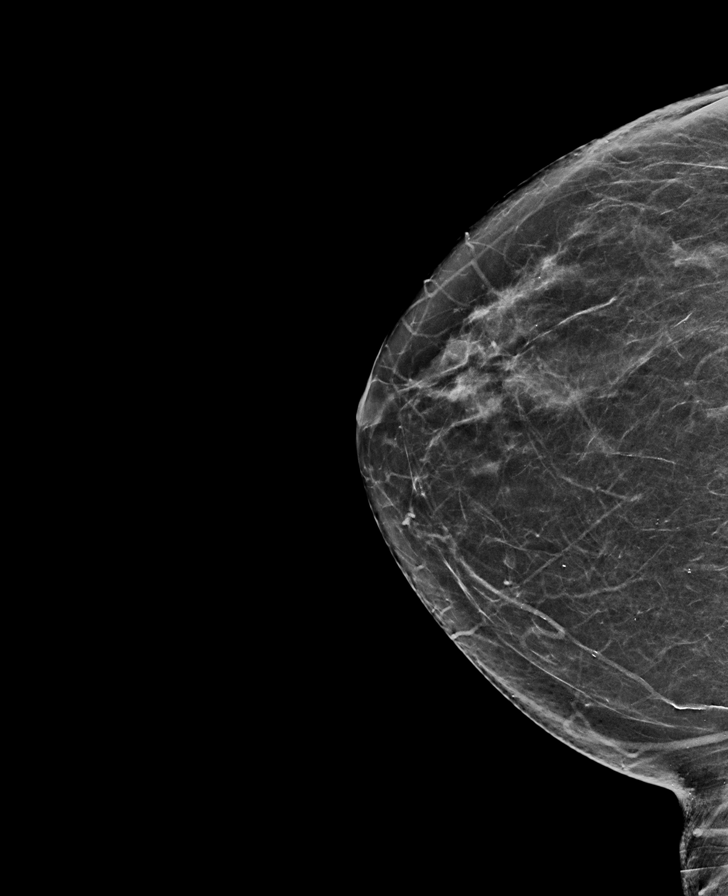

[L CC tomo · tomo slice 32/63.0]
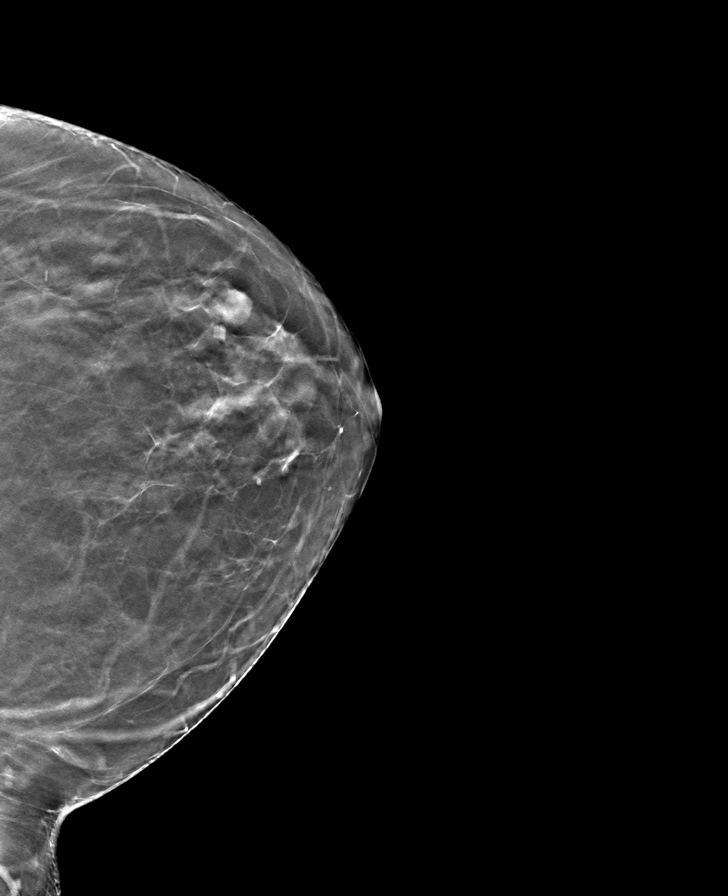

[R CC tomo · tomo slice 33/64.0]
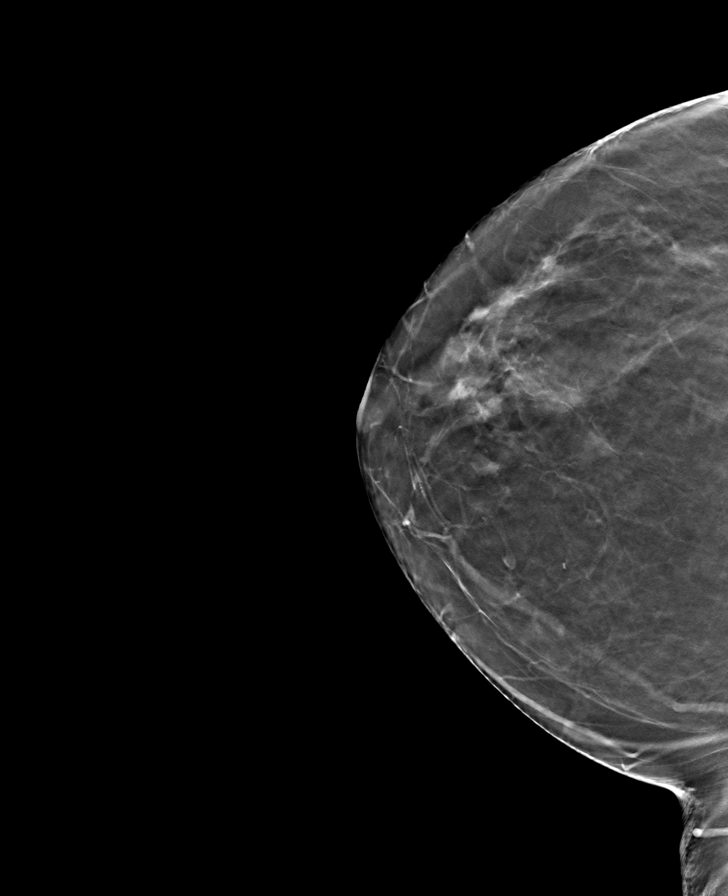

[L MLO tomo · tomo slice 33/64.0]
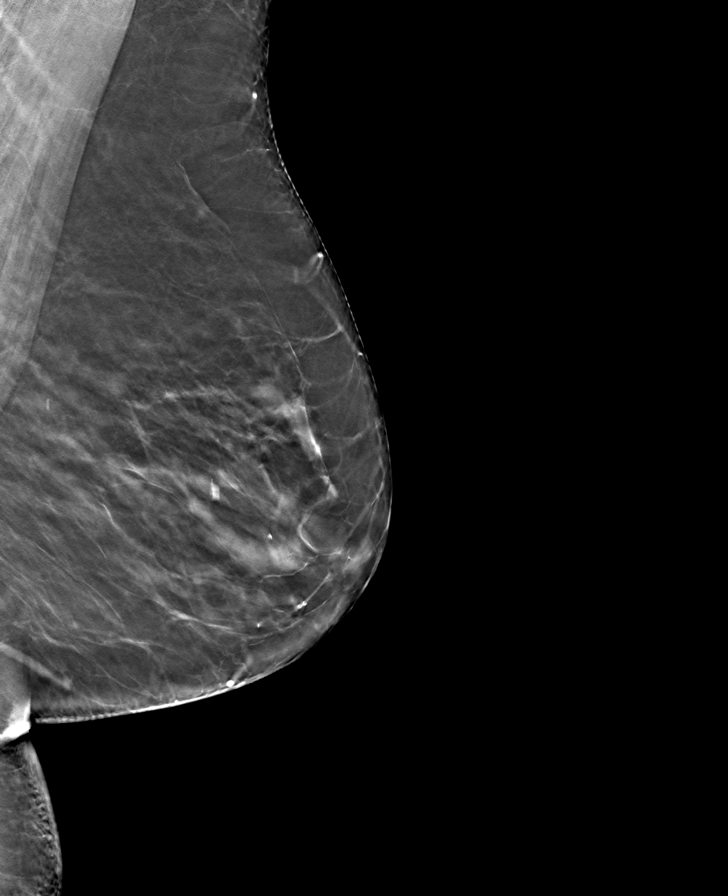

[R MLO tomo · tomo slice 33/64.0]
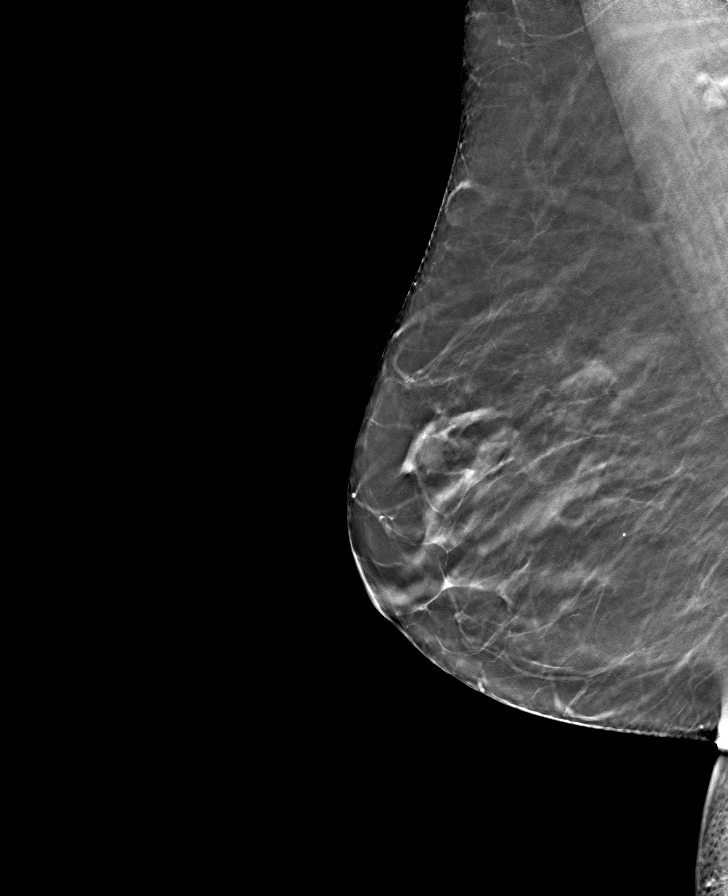

[8 of 24 positions shown; findings below may reference images not displayed]

ACR Breast Density Category b: There are scattered areas of
fibroglandular density.
FINDINGS: There are no findings suspicious for malignancy.
IMPRESSION: No mammographic evidence of malignancy. A result letter of this
screening mammogram will be mailed directly to the patient.

RECOMMENDATION:
Screening mammogram in one year. (Code:51-O-LD2)

BI-RADS CATEGORY  1: Negative.
